# Patient Record
Sex: Male | Born: 1970
Health system: Southern US, Community
[De-identification: ages and names within clinical notes are randomized; demographics above are authoritative.]

---

## 2004-10-25 ENCOUNTER — Emergency Department (HOSPITAL_COMMUNITY): Admission: EM | Admit: 2004-10-25 | Discharge: 2004-10-26 | Payer: Self-pay | Admitting: Emergency Medicine

## 2011-11-13 ENCOUNTER — Ambulatory Visit (INDEPENDENT_AMBULATORY_CARE_PROVIDER_SITE_OTHER): Payer: 59 | Admitting: Surgery

## 2015-04-11 ENCOUNTER — Encounter (HOSPITAL_COMMUNITY): Payer: Self-pay | Admitting: Emergency Medicine

## 2015-04-11 ENCOUNTER — Encounter (HOSPITAL_COMMUNITY): Payer: Self-pay | Admitting: *Deleted

## 2015-04-11 ENCOUNTER — Emergency Department (HOSPITAL_COMMUNITY)
Admission: EM | Admit: 2015-04-11 | Discharge: 2015-04-12 | Disposition: A | Discharge status: Home / Self Care | Payer: 59 | Attending: Emergency Medicine | Admitting: Emergency Medicine

## 2015-04-11 ENCOUNTER — Emergency Department (HOSPITAL_COMMUNITY)
Admission: EM | Admit: 2015-04-11 | Discharge: 2015-04-11 | Disposition: A | Discharge status: Home / Self Care | Payer: 59 | Attending: Emergency Medicine | Admitting: Emergency Medicine

## 2015-04-11 DIAGNOSIS — R197 Diarrhea, unspecified: Secondary | ICD-10-CM | POA: Diagnosis not present

## 2015-04-11 DIAGNOSIS — R112 Nausea with vomiting, unspecified: Secondary | ICD-10-CM | POA: Insufficient documentation

## 2015-04-11 DIAGNOSIS — R1115 Cyclical vomiting syndrome unrelated to migraine: Secondary | ICD-10-CM

## 2015-04-11 DIAGNOSIS — R1013 Epigastric pain: Secondary | ICD-10-CM | POA: Insufficient documentation

## 2015-04-11 DIAGNOSIS — K2970 Gastritis, unspecified, without bleeding: Secondary | ICD-10-CM | POA: Diagnosis not present

## 2015-04-11 LAB — CBC WITH DIFFERENTIAL/PLATELET
Basophils Absolute: 0 10*3/uL (ref 0.0–0.1)
Basophils Relative: 0 % (ref 0–1)
Eosinophils Absolute: 0 10*3/uL (ref 0.0–0.7)
Eosinophils Relative: 0 % (ref 0–5)
HCT: 47 % (ref 39.0–52.0)
Hemoglobin: 16 g/dL (ref 13.0–17.0)
Lymphocytes Relative: 7 % — ABNORMAL LOW (ref 12–46)
Lymphs Abs: 0.7 10*3/uL (ref 0.7–4.0)
MCH: 29.9 pg (ref 26.0–34.0)
MCHC: 34 g/dL (ref 30.0–36.0)
MCV: 87.9 fL (ref 78.0–100.0)
Monocytes Absolute: 0.3 10*3/uL (ref 0.1–1.0)
Monocytes Relative: 3 % (ref 3–12)
Neutro Abs: 8.6 10*3/uL — ABNORMAL HIGH (ref 1.7–7.7)
Neutrophils Relative %: 90 % — ABNORMAL HIGH (ref 43–77)
Platelets: 215 10*3/uL (ref 150–400)
RBC: 5.35 MIL/uL (ref 4.22–5.81)
RDW: 14.4 % (ref 11.5–15.5)
WBC: 9.6 10*3/uL (ref 4.0–10.5)

## 2015-04-11 LAB — COMPREHENSIVE METABOLIC PANEL
ALT: 21 U/L (ref 0–53)
AST: 22 U/L (ref 0–37)
Albumin: 4 g/dL (ref 3.5–5.2)
Alkaline Phosphatase: 78 U/L (ref 39–117)
Anion gap: 13 (ref 5–15)
BUN: 23 mg/dL (ref 6–23)
CO2: 25 mmol/L (ref 19–32)
Calcium: 8.8 mg/dL (ref 8.4–10.5)
Chloride: 101 mmol/L (ref 96–112)
Creatinine, Ser: 1.25 mg/dL (ref 0.50–1.35)
GFR calc Af Amer: 79 mL/min — ABNORMAL LOW (ref >90)
GFR calc non Af Amer: 68 mL/min — ABNORMAL LOW (ref >90)
Glucose, Bld: 135 mg/dL — ABNORMAL HIGH (ref 70–99)
Potassium: 3.4 mmol/L — ABNORMAL LOW (ref 3.5–5.1)
Sodium: 139 mmol/L (ref 135–145)
Total Bilirubin: 0.6 mg/dL (ref 0.3–1.2)
Total Protein: 7.3 g/dL (ref 6.0–8.3)

## 2015-04-11 LAB — LIPASE, BLOOD: Lipase: 18 U/L (ref 11–59)

## 2015-04-11 MED ORDER — SUCRALFATE 1 G PO TABS: 1.0000 g | ORAL_TABLET | Freq: Once | ORAL | Status: DC

## 2015-04-11 MED ORDER — ONDANSETRON HCL 4 MG PO TABS: 4.0000 mg | ORAL_TABLET | Freq: Four times a day (QID) | ORAL | Status: AC

## 2015-04-11 MED ORDER — SODIUM CHLORIDE 0.9 % IV BOLUS (SEPSIS)
1000.0000 mL | Freq: Once | INTRAVENOUS | Status: AC
  Administered 2015-04-11: 1000 mL via INTRAVENOUS

## 2015-04-11 MED ORDER — PROMETHAZINE HCL 25 MG/ML IJ SOLN
12.5000 mg | Freq: Once | INTRAMUSCULAR | Status: AC
  Administered 2015-04-11: 12.5 mg via INTRAVENOUS
  Filled 2015-04-11: qty 1

## 2015-04-11 MED ORDER — GI COCKTAIL ~~LOC~~
30.0000 mL | Freq: Once | ORAL | Status: AC
  Administered 2015-04-11: 30 mL via ORAL
  Filled 2015-04-11: qty 30

## 2015-04-11 MED ORDER — ONDANSETRON HCL 4 MG/2ML IJ SOLN
4.0000 mg | Freq: Once | INTRAMUSCULAR | Status: AC
  Administered 2015-04-11: 4 mg via INTRAVENOUS
  Filled 2015-04-11: qty 2

## 2015-04-11 MED ORDER — KETOROLAC TROMETHAMINE 60 MG/2ML IM SOLN: 30.0000 mg | Freq: Once | INTRAMUSCULAR | Status: DC

## 2015-04-11 NOTE — ED Notes (Signed)
Pt assisted to restroom, pt vomited x1, otherwise tolerated well.  Will monitor.

## 2015-04-11 NOTE — ED Notes (Signed)
Pt presents c/o abdominal pain x 2 days.  Pt also reports N/V/D.  Pt a x 4, NAD.

## 2015-04-11 NOTE — Discharge Instructions (Signed)
Gastritis, Adult Gastritis is soreness and puffiness (inflammation) of the lining of the stomach. If you do not get help, gastritis can cause bleeding and sores (ulcers) in the stomach. HOME CARE   Only take medicine as told by your doctor.  If you were given antibiotic medicines, take them as told. Finish the medicines even if you start to feel better.  Drink enough fluids to keep your pee (urine) clear or pale yellow.  Avoid foods and drinks that make your problems worse. Foods you may want to avoid include:  Caffeine or alcohol.  Chocolate.  Mint.  Garlic and onions.  Spicy foods.  Citrus fruits, including oranges, lemons, or limes.  Food containing tomatoes, including sauce, chili, salsa, and pizza.  Fried and fatty foods.  Eat small meals throughout the day instead of large meals. GET HELP RIGHT AWAY IF:   You have black or dark red poop (stools).  You throw up (vomit) blood. It may look like coffee grounds.  You cannot keep fluids down.  Your belly (abdominal) pain gets worse.  You have a fever.  You do not feel better after 1 week.  You have any other questions or concerns. MAKE SURE YOU:   Understand these instructions.  Will watch your condition.  Will get help right away if you are not doing well or get worse. Document Released: 05/21/2008 Document Revised: 02/25/2012 Document Reviewed: 01/16/2012 [facility] Patient Information 2015 [employer], [place]. This information is not intended to replace advice given to you by your health care provider. Make sure you discuss any questions you have with your health care provider.  Please use medication as directed for nausea. Plenty fluids, bananas rice apples and toast diet till diarrhea discontinues. If you're not able to tolerate fluid intake and continue to appearance vomiting and diarrhea please seek additional medical care.

## 2015-04-11 NOTE — ED Notes (Signed)
Pt seen here for same this morning and D/C'd with Rx but unable to keep PO medications down;

## 2015-04-11 NOTE — ED Notes (Signed)
PA at bedside.

## 2015-04-11 NOTE — ED Provider Notes (Signed)
CSN: [id number]     Arrival date & time 04/11/15  0909 History   First MD Initiated Contact with Patient 04/11/15 [phone]     Chief Complaint  Patient presents with  . Abdominal Pain     HPI   44 year old male presents with nausea vomiting. Patient reports that Saturday he experienced multiple episodes of nausea and nonbloody emesis. He states this is continuing since with the addition of epigastric abdominal pain and diarrhea. He states that since the onset of symptoms she's not been able to eat or drink and has been resting. Patient denies fever, headache, dizziness, chest pain, lower abdominal pain, changes in bowel or bladder characteristics or frequency the exception of diarrhea, or lower extremity swelling. Patient notes he is otherwise healthy and does not take any medications, no alcohol ingestion, no use of illicit drugs. Patient denies close sick contacts, exposure to abnormal food or drink, no recent travel history.  No past medical history on file. No past surgical history on file. No family history on file. History  Substance Use Topics  . Smoking status: Not on file  . Smokeless tobacco: Not on file  . Alcohol Use: Not on file    Review of Systems  All other systems reviewed and are negative.   Allergies  Review of patient's allergies indicates not on file.  Home Medications   Prior to Admission medications   Not on File   BP 141/88 mmHg  Pulse 58  Temp(Src) 97.9 F (36.6 C) (Oral)  Resp 16  Ht 5\' 10"  (1.778 m)  Wt 180 lb (81.647 kg)  BMI 25.83 kg/m2  SpO2 100% Physical Exam  Constitutional: He is oriented to person, place, and time. He appears well-developed and well-nourished.  HENT:  Head: Normocephalic and atraumatic.  Eyes: Pupils are equal, round, and reactive to light.  Neck: Normal range of motion. Neck supple. No JVD present. No tracheal deviation present. No thyromegaly present.  Cardiovascular: Normal rate, regular rhythm, normal heart sounds and  intact distal pulses.  Exam reveals no gallop and no friction rub.   No murmur heard. Pulmonary/Chest: Effort normal and breath sounds normal. No stridor. No respiratory distress. He has no wheezes. He has no rales. He exhibits no tenderness.  Abdominal: Soft. Normal appearance and bowel sounds are normal. There is no hepatosplenomegaly, splenomegaly or hepatomegaly. There is tenderness in the epigastric area. There is no rigidity, no rebound, no guarding, no CVA tenderness, no tenderness at McBurney's point and negative Murphy's sign. No hernia. Hernia confirmed negative in the ventral area.  Musculoskeletal: Normal range of motion.  Lymphadenopathy:    He has no cervical adenopathy.  Neurological: He is alert and oriented to person, place, and time. Coordination normal.  Skin: Skin is warm and dry.  Psychiatric: He has a normal mood and affect. His behavior is normal. Judgment and thought content normal.  Nursing note and vitals reviewed.   ED Course  Procedures (including critical care time) Labs Review Labs Reviewed - No data to display  Imaging Review No results found.   EKG Interpretation None      MDM   Final diagnoses:  Gastritis   Labs: CBC, CMP, lipase no significant findings  Imaging: None indicated  Consults: None  Therapeutics: Normal saline, Zofran, Phenergan  Assessment: Gastritis  Plan: Patient's presentation likely represents gastritis. Patient was able to ambulate without difficulty, reports improvement with GI cocktail, abdominal pain has not worsened since presentation. Patient continues to move bowels making small bowel  obstruction unlikely. No history of GERD related symptoms, bloody stools, dark stools; unlikely gastric ulcer. Patient was afebrile with normal vital signs throughout stay; improved from initial evaluation. Patient was discharged home with Zofran as needed for the nausea and vomiting, and strict return precautions were given in the event  symptoms did not improve or worsen. Patient understood and agreed to plan, and had no acute concerns at time of discharge.      Eyvonne Mechanic, PA-C 04/11/15 1529  Tilden Fossa, MD 04/11/15 [phone]

## 2015-04-12 LAB — CBC WITH DIFFERENTIAL/PLATELET
Basophils Absolute: 0 10*3/uL (ref 0.0–0.1)
Basophils Relative: 0 % (ref 0–1)
Eosinophils Absolute: 0 10*3/uL (ref 0.0–0.7)
Eosinophils Relative: 0 % (ref 0–5)
HCT: 44.1 % (ref 39.0–52.0)
Hemoglobin: 15 g/dL (ref 13.0–17.0)
Lymphocytes Relative: 9 % — ABNORMAL LOW (ref 12–46)
Lymphs Abs: 1.2 10*3/uL (ref 0.7–4.0)
MCH: 29.5 pg (ref 26.0–34.0)
MCHC: 34 g/dL (ref 30.0–36.0)
MCV: 86.6 fL (ref 78.0–100.0)
Monocytes Absolute: 1.1 10*3/uL — ABNORMAL HIGH (ref 0.1–1.0)
Monocytes Relative: 8 % (ref 3–12)
Neutro Abs: 11.9 10*3/uL — ABNORMAL HIGH (ref 1.7–7.7)
Neutrophils Relative %: 83 % — ABNORMAL HIGH (ref 43–77)
Platelets: 215 10*3/uL (ref 150–400)
RBC: 5.09 MIL/uL (ref 4.22–5.81)
RDW: 14.3 % (ref 11.5–15.5)
WBC: 14.2 10*3/uL — ABNORMAL HIGH (ref 4.0–10.5)

## 2015-04-12 LAB — COMPREHENSIVE METABOLIC PANEL
ALT: 18 U/L (ref 0–53)
AST: 18 U/L (ref 0–37)
Albumin: 3.7 g/dL (ref 3.5–5.2)
Alkaline Phosphatase: 70 U/L (ref 39–117)
Anion gap: 10 (ref 5–15)
BUN: 24 mg/dL — ABNORMAL HIGH (ref 6–23)
CO2: 27 mmol/L (ref 19–32)
Calcium: 8.5 mg/dL (ref 8.4–10.5)
Chloride: 102 mmol/L (ref 96–112)
Creatinine, Ser: 1.18 mg/dL (ref 0.50–1.35)
GFR calc Af Amer: 85 mL/min — ABNORMAL LOW (ref >90)
GFR calc non Af Amer: 73 mL/min — ABNORMAL LOW (ref >90)
Glucose, Bld: 131 mg/dL — ABNORMAL HIGH (ref 70–99)
Potassium: 3.5 mmol/L (ref 3.5–5.1)
Sodium: 139 mmol/L (ref 135–145)
Total Bilirubin: 0.6 mg/dL (ref 0.3–1.2)
Total Protein: 6.6 g/dL (ref 6.0–8.3)

## 2015-04-12 LAB — LIPASE, BLOOD: Lipase: 25 U/L (ref 11–59)

## 2015-04-12 MED ORDER — PROMETHAZINE HCL 25 MG RE SUPP: 25.0000 mg | Freq: Four times a day (QID) | RECTAL | Status: DC | PRN

## 2015-04-12 MED ORDER — SODIUM CHLORIDE 0.9 % IV BOLUS (SEPSIS)
1000.0000 mL | Freq: Once | INTRAVENOUS | Status: AC
  Administered 2015-04-12: 1000 mL via INTRAVENOUS

## 2015-04-12 MED ORDER — METOCLOPRAMIDE HCL 10 MG PO TABS: 10.0000 mg | ORAL_TABLET | Freq: Four times a day (QID) | ORAL | Status: DC

## 2015-04-12 MED ORDER — PANTOPRAZOLE SODIUM 40 MG IV SOLR
40.0000 mg | Freq: Once | INTRAVENOUS | Status: AC
  Administered 2015-04-12: 40 mg via INTRAVENOUS
  Filled 2015-04-12: qty 40

## 2015-04-12 MED ORDER — PROMETHAZINE HCL 25 MG/ML IJ SOLN
12.5000 mg | Freq: Once | INTRAMUSCULAR | Status: AC
  Administered 2015-04-12: 12.5 mg via INTRAVENOUS
  Filled 2015-04-12: qty 1

## 2015-04-12 MED ORDER — METOCLOPRAMIDE HCL 5 MG/ML IJ SOLN
10.0000 mg | Freq: Once | INTRAMUSCULAR | Status: AC
  Administered 2015-04-12: 10 mg via INTRAVENOUS
  Filled 2015-04-12: qty 2

## 2015-04-12 NOTE — ED Provider Notes (Signed)
CSN: [id number]     Arrival date & time 04/11/15  2033 History   First MD Initiated Contact with Patient 04/11/15 2351     Chief Complaint  Patient presents with  . Emesis  . Abdominal Pain     (Consider location/radiation/quality/duration/timing/severity/associated sxs/prior Treatment) HPI Amirr Achord is a 44 y.o. male with no medical problems, presents to emergency department complaining of nausea, vomiting, epigastric abdominal pain, diarrhea. Patient's symptoms started 3 days ago. He states he is persistently vomiting since then. He is unable to keep any liquids or solids down. He was seen for this this morning, was given GI cocktail and Zofran which he states did not help. He was discharged home without oral trial. He was given a prescription for 4 mg Zofran which he states did not dissolve in his mouth so he chewed it up but it did not help his nausea. He states even small sips of water he throws up. His states that his pain is just in the epigastric area and is not sure if it is from vomiting. He denies any alcohol use. He admits to marijuana use daily. No other illicit drugs.  History reviewed. No pertinent past medical history. History reviewed. No pertinent past surgical history. History reviewed. No pertinent family history. History  Substance Use Topics  . Smoking status: Never Smoker   . Smokeless tobacco: Not on file  . Alcohol Use: No    Review of Systems  Constitutional: Negative for fever and chills.  Respiratory: Negative for cough, chest tightness and shortness of breath.   Cardiovascular: Negative for chest pain, palpitations and leg swelling.  Gastrointestinal: Positive for nausea, vomiting, abdominal pain and diarrhea. Negative for abdominal distention.  Genitourinary: Negative for dysuria, urgency, frequency and hematuria.  Musculoskeletal: Negative for myalgias, arthralgias, neck pain and neck stiffness.  Skin: Negative for rash.  Allergic/Immunologic:  Negative for immunocompromised state.  Neurological: Negative for dizziness, weakness, light-headedness, numbness and headaches.  All other systems reviewed and are negative.     Allergies  Review of patient's allergies indicates no known allergies.  Home Medications   Prior to Admission medications   Medication Sig Start Date End Date Taking? Authorizing Provider  ondansetron (ZOFRAN) 4 MG tablet Take 1 tablet (4 mg total) by mouth every 6 (six) hours. 04/11/15  Yes Jeffrey Hedges, PA-C   BP 131/96 mmHg  Pulse 72  Temp(Src) 98 F (36.7 C) (Oral)  Resp 16  Ht  (1.803 m)  Wt 180 lb (81.647 kg)  BMI 25.12 kg/m2  SpO2 99% Physical Exam  Constitutional: He is oriented to person, place, and time. He appears well-developed and well-nourished. No distress.  HENT:  Head: Normocephalic and atraumatic.  Oral mucosa dry  Eyes: Conjunctivae are normal.  Neck: Neck supple.  Cardiovascular: Normal rate, regular rhythm and normal heart sounds.   Pulmonary/Chest: Effort normal. No respiratory distress. He has no wheezes. He has no rales.  Abdominal: Soft. Bowel sounds are normal. He exhibits no distension. There is tenderness. There is no rebound and no guarding.  Epigastric tenderness  Musculoskeletal: He exhibits no edema.  Neurological: He is alert and oriented to person, place, and time.  Skin: Skin is warm and dry.  Nursing note and vitals reviewed.   ED Course  Procedures (including critical care time) Labs Review Labs Reviewed  CBC WITH DIFFERENTIAL/PLATELET - Abnormal; Notable for the following:    WBC 14.2 (*)    Neutrophils Relative % 83 (*)    Neutro Abs  11.9 (*)    Lymphocytes Relative 9 (*)    Monocytes Absolute 1.1 (*)    All other components within normal limits  COMPREHENSIVE METABOLIC PANEL - Abnormal; Notable for the following:    Glucose, Bld 131 (*)    BUN 24 (*)    GFR calc non Af Amer 73 (*)    GFR calc Af Amer 85 (*)    All other components within  normal limits  LIPASE, BLOOD    Imaging Review No results found.   EKG Interpretation None      MDM   Final diagnoses:  Non-intractable cyclical vomiting with nausea     Sheila nausea, vomiting, diarrhea for 3 days, seen this morning for the same. States his discharge was Zofran which is not helping. He continues to vomit after he eats or drinks anything. His abdominal exam is benign with only slight tenderness in epigastric area. I do not think patient has a surgical abdomen at this time. Will repeat labs and will try Reglan for emesis.  1:22 AM Patient is feeling much better. Abdomen reassessed, no tenderness at this time. I do not think he needs any imaging. Gross possibility of cannabinoid cyclical vomiting syndrome, given he has had similar symptoms in the past. Instructed to stop smoking marijuana. We'll try to see if patient can tolerate oral fluids.  1:59 AM Patient drinks 2 cups of water. He is feeling better. We'll discharge home with Reglan and Phenergan suppositories. Follow-up as needed. Patient is comfortable going home  Filed Vitals:   04/12/15 0100 04/12/15 0115 04/12/15 0145 04/12/15 0147  BP: 123/66 123/59 133/59   Pulse: 67 83  62  Temp:      TempSrc:      Resp:      Height:      Weight:      SpO2: 98% 96% 98%      Jaynie Crumbleatyana Keino Placencia, PA-C 04/12/15 0200  Mancel BaleElliott Wentz, MD 04/12/15 2336

## 2015-04-12 NOTE — Discharge Instructions (Signed)
Phenergan suppositories for vomiting. reglan in addition for nausea relief. Follow up with your doctor or wellness center as referred. Try to quit smoking marijuana. Return if worsening.   Nausea and Vomiting Nausea is a sick feeling that often comes before throwing up (vomiting). Vomiting is a reflex where stomach contents come out of your mouth. Vomiting can cause severe loss of body fluids (dehydration). Children and elderly adults can become dehydrated quickly, especially if they also have diarrhea. Nausea and vomiting are symptoms of a condition or disease. It is important to find the cause of your symptoms. CAUSES   Direct irritation of the stomach lining. This irritation can result from increased acid production (gastroesophageal reflux disease), infection, food poisoning, taking certain medicines (such as nonsteroidal anti-inflammatory drugs), alcohol use, or tobacco use.  Signals from the brain.These signals could be caused by a headache, heat exposure, an inner ear disturbance, increased pressure in the brain from injury, infection, a tumor, or a concussion, pain, emotional stimulus, or metabolic problems.  An obstruction in the gastrointestinal tract (bowel obstruction).  Illnesses such as diabetes, hepatitis, gallbladder problems, appendicitis, kidney problems, cancer, sepsis, atypical symptoms of a heart attack, or eating disorders.  Medical treatments such as chemotherapy and radiation.  Receiving medicine that makes you sleep (general anesthetic) during surgery. DIAGNOSIS Your caregiver may ask for tests to be done if the problems do not improve after a few days. Tests may also be done if symptoms are severe or if the reason for the nausea and vomiting is not clear. Tests may include:  Urine tests.  Blood tests.  Stool tests.  Cultures (to look for evidence of infection).  X-rays or other imaging studies. Test results can help your caregiver make decisions about treatment  or the need for additional tests. TREATMENT You need to stay well hydrated. Drink frequently but in small amounts.You may wish to drink water, sports drinks, clear broth, or eat frozen ice pops or gelatin dessert to help stay hydrated.When you eat, eating slowly may help prevent nausea.There are also some antinausea medicines that may help prevent nausea. HOME CARE INSTRUCTIONS   Take all medicine as directed by your caregiver.  If you do not have an appetite, do not force yourself to eat. However, you must continue to drink fluids.  If you have an appetite, eat a normal diet unless your caregiver tells you differently.  Eat a variety of complex carbohydrates (rice, wheat, potatoes, bread), lean meats, yogurt, fruits, and vegetables.  Avoid high-fat foods because they are more difficult to digest.  Drink enough water and fluids to keep your urine clear or pale yellow.  If you are dehydrated, ask your caregiver for specific rehydration instructions. Signs of dehydration may include:  Severe thirst.  Dry lips and mouth.  Dizziness.  Dark urine.  Decreasing urine frequency and amount.  Confusion.  Rapid breathing or pulse. SEEK IMMEDIATE MEDICAL CARE IF:   You have blood or brown flecks (like coffee grounds) in your vomit.  You have black or bloody stools.  You have a severe headache or stiff neck.  You are confused.  You have severe abdominal pain.  You have chest pain or trouble breathing.  You do not urinate at least once every 8 hours.  You develop cold or clammy skin.  You continue to vomit for longer than 24 to 48 hours.  You have a fever. MAKE SURE YOU:   Understand these instructions.  Will watch your condition.  Will get help right  away if you are not doing well or get worse. Document Released: 12/03/2005 Document Revised: 02/25/2012 Document Reviewed: 05/02/2011 [facility] Patient Information 2015 [place], [place]. This information is not intended  to replace advice given to you by your health care provider. Make sure you discuss any questions you have with your health care provider.

## 2015-09-27 ENCOUNTER — Emergency Department (HOSPITAL_COMMUNITY)
Admission: EM | Admit: 2015-09-27 | Discharge: 2015-09-27 | Disposition: A | Discharge status: Home / Self Care | Payer: 59 | Attending: Emergency Medicine | Admitting: Emergency Medicine

## 2015-09-27 ENCOUNTER — Encounter (HOSPITAL_COMMUNITY): Payer: Self-pay | Admitting: Emergency Medicine

## 2015-09-27 DIAGNOSIS — Y9389 Activity, other specified: Secondary | ICD-10-CM | POA: Insufficient documentation

## 2015-09-27 DIAGNOSIS — T148XXA Other injury of unspecified body region, initial encounter: Secondary | ICD-10-CM

## 2015-09-27 DIAGNOSIS — X58XXXA Exposure to other specified factors, initial encounter: Secondary | ICD-10-CM | POA: Insufficient documentation

## 2015-09-27 DIAGNOSIS — S30851A Superficial foreign body of abdominal wall, initial encounter: Secondary | ICD-10-CM | POA: Insufficient documentation

## 2015-09-27 DIAGNOSIS — Y9289 Other specified places as the place of occurrence of the external cause: Secondary | ICD-10-CM | POA: Insufficient documentation

## 2015-09-27 DIAGNOSIS — Y998 Other external cause status: Secondary | ICD-10-CM | POA: Insufficient documentation

## 2015-09-27 MED ORDER — LIDOCAINE-EPINEPHRINE (PF) 2 %-1:200000 IJ SOLN
10.0000 mL | Freq: Once | INTRAMUSCULAR | Status: AC
  Administered 2015-09-27: 10 mL
  Filled 2015-09-27: qty 20

## 2015-09-27 MED ORDER — ONDANSETRON 4 MG PO TBDP
8.0000 mg | ORAL_TABLET | Freq: Once | ORAL | Status: AC
  Administered 2015-09-27: 8 mg via ORAL
  Filled 2015-09-27: qty 2

## 2015-09-27 NOTE — Discharge Instructions (Signed)
Read the information below.  You may return to the Emergency Department at any time for worsening condition or any new symptoms that concern you.  If you develop redness, swelling, pus draining from the wound, or fevers greater than 100.4, return to the ER immediately for a recheck.   °

## 2015-09-27 NOTE — ED Notes (Signed)
Pt from home for eval of splinter in skin, pt states he was sliding wood this morning when he felt a splinter go into the skin on abdomen, pt states tried to pull it out but states feels like its still there. No n/v/d noted. Pt alert and oriented, skin warm and dry. reddness noted to lower abd at site.

## 2015-09-27 NOTE — ED Notes (Signed)
Pt became pale and diaphoretic after Lidocaine injection to abd.

## 2015-09-27 NOTE — ED Notes (Signed)
Pt had strong urge to go to bathroom. Head elevated gradually. Pt remained warm and dry at this time. Assisted to bathroom.

## 2015-09-27 NOTE — ED Provider Notes (Signed)
CSN: [id number]     Arrival date & time 09/27/15  1102 History  By signing my name below, I, Willie Scott, attest that this documentation has been prepared under the direction and in the presence of [facility], PA-C. Electronically Signed: Charline Bills, ED Scribe 09/27/2015 at 12:18 PM.   Chief Complaint  Patient presents with  . Foreign Body in Skin   The history is provided by the patient. No language interpreter was used.   HPI Comments: Willie Scott is a 44 y.o. male who presents to the Emergency Department complaining of a foreign object that entered the skin in his lower right abdomen this morning. Pt states that he was sliding a piece of wood on his deck when he sustained a painful splinter to his abdomen. He describes pain as a constant burning sensation that is exacerbated with palpation. Pt tried to remove the splinter without success. He denies abdominal pain or other symptoms. No known medical allergies.  Last tetanus vx 4 years ago.    History reviewed. No pertinent past medical history. History reviewed. No pertinent past surgical history. No family history on file. Social History  Substance Use Topics  . Smoking status: Never Smoker   . Smokeless tobacco: None  . Alcohol Use: No    Review of Systems  Constitutional: Negative for fever.  Gastrointestinal: Negative for vomiting and abdominal pain.  Skin: Positive for wound. Negative for color change.  Allergic/Immunologic: Negative for immunocompromised state.  Hematological: Does not bruise/bleed easily.  Psychiatric/Behavioral: Negative for self-injury.   Allergies  Review of patient's allergies indicates no known allergies.  Home Medications   Prior to Admission medications   Medication Sig Start Date End Date Taking? Authorizing Provider  metoCLOPramide (REGLAN) 10 MG tablet Take 1 tablet (10 mg total) by mouth every 6 (six) hours. 04/12/15   Tatyana Kirichenko, PA-C  ondansetron (ZOFRAN) 4 MG tablet Take  1 tablet (4 mg total) by mouth every 6 (six) hours. 04/11/15   Eyvonne Mechanic, PA-C  promethazine (PHENERGAN) 25 MG suppository Place 1 suppository (25 mg total) rectally every 6 (six) hours as needed for nausea or vomiting. 04/12/15   Tatyana Kirichenko, PA-C   BP 125/81 mmHg  Pulse 83  Temp(Src) 98 F (36.7 C) (Oral)  Resp 16  Ht  (1.803 m)  Wt 184 lb (83.462 kg)  BMI 25.67 kg/m2  SpO2 99% Physical Exam  Constitutional: He appears well-developed and well-nourished. No distress.  HENT:  Head: Normocephalic and atraumatic.  Neck: Neck supple.  Pulmonary/Chest: Effort normal.  Abdominal: Soft. There is no tenderness. There is no rebound and no guarding.  Neurological: He is alert.  Skin: He is not diaphoretic.  Palpable linear foreign body in the subcutaneous tissues of the RLQ.   Psychiatric: He has a normal mood and affect. His behavior is normal.  Nursing note and vitals reviewed.  ED Course  .Foreign Body Removal Date/Time: 09/27/2015 12:40 PM Performed by: Trixie Dredge Authorized by: Trixie Dredge Consent: Verbal consent obtained. Consent given by: patient Patient understanding: patient states understanding of the procedure being performed Body area: skin General location: trunk Location details: abdomen Anesthesia: local infiltration Local anesthetic: lidocaine 2% with epinephrine Anesthetic total: 5 ml Patient sedated: no Patient restrained: no Patient cooperative: yes Localization method: palpated. Removal mechanism: hemostat and scalpel Dressing: antibiotic ointment and dressing applied Tendon involvement: none Depth: subcutaneous Complexity: simple 1 objects recovered. Objects recovered: wooden splinter Post-procedure assessment: foreign body removed Patient tolerance: Patient tolerated the procedure  well with no immediate complications Comments: Single 4-0 vicryl rapide suture placed, simple interrupted   (including critical care time) DIAGNOSTIC  STUDIES: Oxygen Saturation is 99% on RA, normal by my interpretation.    COORDINATION OF CARE: 11:56 AM-Discussed treatment plan with pt at bedside and pt agreed to plan.   Labs Review Labs Reviewed - No data to display  Imaging Review No results found. I have personally reviewed and evaluated these images and lab results as part of my medical decision-making.   EKG Interpretation None      \ MDM   Final diagnoses:  Foreign body in skin   Afebrile, nontoxic patient with foreign body in subcutaneous tissues of abdomen that occurred just prior to arrival.  Tetanus UTD.   Wound thoroughly cleansed, one suture placed.  Wound was new, pressure treated.  D/C home with wound care instructions.  Discussed result, findings, treatment, and follow up  with patient.  Pt given return precautions.  Pt verbalizes understanding and agrees with plan.        I personally performed the services described in this documentation, which was scribed in my presence. The recorded information has been reviewed and is accurate.    Trixie Dredge, PA-C 09/27/15 1343  Melene Plan, DO 09/27/15 1440

## 2016-04-15 ENCOUNTER — Encounter (HOSPITAL_COMMUNITY): Payer: Self-pay | Admitting: *Deleted

## 2016-04-15 ENCOUNTER — Emergency Department (HOSPITAL_COMMUNITY)
Admission: EM | Admit: 2016-04-15 | Discharge: 2016-04-15 | Disposition: A | Discharge status: Home / Self Care | Payer: 59 | Attending: Emergency Medicine | Admitting: Emergency Medicine

## 2016-04-15 DIAGNOSIS — R1013 Epigastric pain: Secondary | ICD-10-CM | POA: Insufficient documentation

## 2016-04-15 DIAGNOSIS — Z79899 Other long term (current) drug therapy: Secondary | ICD-10-CM | POA: Insufficient documentation

## 2016-04-15 DIAGNOSIS — R112 Nausea with vomiting, unspecified: Secondary | ICD-10-CM | POA: Diagnosis not present

## 2016-04-15 DIAGNOSIS — R197 Diarrhea, unspecified: Secondary | ICD-10-CM | POA: Diagnosis present

## 2016-04-15 LAB — COMPREHENSIVE METABOLIC PANEL
ALT: 21 U/L (ref 17–63)
AST: 21 U/L (ref 15–41)
Albumin: 4.4 g/dL (ref 3.5–5.0)
Alkaline Phosphatase: 75 U/L (ref 38–126)
Anion gap: 12 (ref 5–15)
BUN: 31 mg/dL — ABNORMAL HIGH (ref 6–20)
CO2: 28 mmol/L (ref 22–32)
Calcium: 9.5 mg/dL (ref 8.9–10.3)
Chloride: 99 mmol/L — ABNORMAL LOW (ref 101–111)
Creatinine, Ser: 1.08 mg/dL (ref 0.61–1.24)
GFR calc Af Amer: >60 mL/min (ref >60)
GFR calc non Af Amer: >60 mL/min (ref >60)
Glucose, Bld: 122 mg/dL — ABNORMAL HIGH (ref 65–99)
Potassium: 3.2 mmol/L — ABNORMAL LOW (ref 3.5–5.1)
Sodium: 139 mmol/L (ref 135–145)
Total Bilirubin: 1.1 mg/dL (ref 0.3–1.2)
Total Protein: 7.6 g/dL (ref 6.5–8.1)

## 2016-04-15 LAB — LIPASE, BLOOD: Lipase: 23 U/L (ref 11–51)

## 2016-04-15 LAB — CBC
HCT: 51.2 % (ref 39.0–52.0)
Hemoglobin: 17.4 g/dL — ABNORMAL HIGH (ref 13.0–17.0)
MCH: 30.2 pg (ref 26.0–34.0)
MCHC: 34 g/dL (ref 30.0–36.0)
MCV: 88.9 fL (ref 78.0–100.0)
Platelets: 267 10*3/uL (ref 150–400)
RBC: 5.76 MIL/uL (ref 4.22–5.81)
RDW: 14.8 % (ref 11.5–15.5)
WBC: 16.1 10*3/uL — ABNORMAL HIGH (ref 4.0–10.5)

## 2016-04-15 MED ORDER — POTASSIUM CHLORIDE 10 MEQ/100ML IV SOLN
10.0000 meq | Freq: Once | INTRAVENOUS | Status: AC
  Administered 2016-04-15: 10 meq via INTRAVENOUS
  Filled 2016-04-15: qty 100

## 2016-04-15 MED ORDER — SODIUM CHLORIDE 0.9 % IV BOLUS (SEPSIS)
1000.0000 mL | Freq: Once | INTRAVENOUS | Status: AC
  Administered 2016-04-15: 1000 mL via INTRAVENOUS

## 2016-04-15 MED ORDER — METOCLOPRAMIDE HCL 5 MG/ML IJ SOLN
10.0000 mg | Freq: Once | INTRAMUSCULAR | Status: AC
  Administered 2016-04-15: 10 mg via INTRAVENOUS
  Filled 2016-04-15: qty 2

## 2016-04-15 MED ORDER — ONDANSETRON 4 MG PO TBDP: ORAL_TABLET | ORAL | Status: DC

## 2016-04-15 MED ORDER — ONDANSETRON HCL 4 MG/2ML IJ SOLN
4.0000 mg | Freq: Once | INTRAMUSCULAR | Status: AC
  Administered 2016-04-15: 4 mg via INTRAVENOUS
  Filled 2016-04-15: qty 2

## 2016-04-15 MED ORDER — KETOROLAC TROMETHAMINE 15 MG/ML IJ SOLN
15.0000 mg | Freq: Once | INTRAMUSCULAR | Status: AC
  Administered 2016-04-15: 15 mg via INTRAVENOUS
  Filled 2016-04-15: qty 1

## 2016-04-15 NOTE — ED Provider Notes (Signed)
CSN: [id number]     Arrival date & time 04/15/16  1519 History   First MD Initiated Contact with Patient 04/15/16 1850     Chief Complaint  Patient presents with  . Emesis  . Diarrhea     (Consider location/radiation/quality/duration/timing/severity/associated sxs/prior Treatment) HPI Comments: 45 year old male with no significant medical history no surgery history no history colonoscopy or GI issues presents with recurrent vomiting and diarrhea no blood in the past 3 days. No significant sick contacts, no surgery, no travel, no recent antibiotics. Intermittent symptoms. Worse epigastric discomfort  Patient is a 45 y.o. male presenting with vomiting and diarrhea. The history is provided by the patient.  Emesis Associated symptoms: abdominal pain and diarrhea   Associated symptoms: no chills and no headaches   Diarrhea Associated symptoms: abdominal pain and vomiting   Associated symptoms: no chills, no fever and no headaches     History reviewed. No pertinent past medical history. History reviewed. No pertinent past surgical history. History reviewed. No pertinent family history. Social History  Substance Use Topics  . Smoking status: Never Smoker   . Smokeless tobacco: None  . Alcohol Use: No    Review of Systems  Constitutional: Negative for fever and chills.  HENT: Negative for congestion.   Eyes: Negative for visual disturbance.  Respiratory: Negative for shortness of breath.   Cardiovascular: Negative for chest pain.  Gastrointestinal: Positive for vomiting, abdominal pain and diarrhea. Negative for blood in stool.  Genitourinary: Negative for dysuria and flank pain.  Musculoskeletal: Negative for back pain, neck pain and neck stiffness.  Skin: Negative for rash.  Neurological: Negative for light-headedness and headaches.      Allergies  Review of patient's allergies indicates no known allergies.  Home Medications   Prior to Admission medications   Medication  Sig Start Date End Date Taking? Authorizing Provider  alum & mag hydroxide-simeth (MAALOX/MYLANTA) 200-200-20 MG/5ML suspension Take 30 mLs by mouth every 6 (six) hours as needed for indigestion or heartburn.   Yes Historical Provider, MD  metoCLOPramide (REGLAN) 10 MG tablet Take 1 tablet (10 mg total) by mouth every 6 (six) hours. 04/12/15   Tatyana Kirichenko, PA-C  ondansetron (ZOFRAN) 4 MG tablet Take 1 tablet (4 mg total) by mouth every 6 (six) hours. 04/11/15   Eyvonne Mechanic, PA-C  promethazine (PHENERGAN) 25 MG suppository Place 1 suppository (25 mg total) rectally every 6 (six) hours as needed for nausea or vomiting. 04/12/15   Tatyana Kirichenko, PA-C   BP 128/85 mmHg  Pulse 70  Temp(Src) 98.4 F (36.9 C) (Oral)  Resp 15  Ht  (1.803 m)  Wt 166 lb 1.6 oz (75.342 kg)  BMI 23.18 kg/m2  SpO2 93% Physical Exam  Constitutional: He is oriented to person, place, and time. He appears well-developed and well-nourished.  HENT:  Head: Normocephalic and atraumatic.  Dry mucous membranes  Eyes: Conjunctivae are normal. Right eye exhibits no discharge. Left eye exhibits no discharge.  Neck: Normal range of motion. Neck supple. No tracheal deviation present.  Cardiovascular: Normal rate and regular rhythm.   Pulmonary/Chest: Effort normal and breath sounds normal.  Abdominal: Soft. He exhibits no distension. There is tenderness (mild epigastric). There is no guarding.  Musculoskeletal: He exhibits no edema.  Neurological: He is alert and oriented to person, place, and time.  Skin: Skin is warm. No rash noted.  Psychiatric: He has a normal mood and affect.  Nursing note and vitals reviewed.   ED Course  Procedures (including critical  care time) Labs Review Labs Reviewed  COMPREHENSIVE METABOLIC PANEL - Abnormal; Notable for the following:    Potassium 3.2 (*)    Chloride 99 (*)    Glucose, Bld 122 (*)    BUN 31 (*)    All other components within normal limits  CBC - Abnormal;  Notable for the following:    WBC 16.1 (*)    Hemoglobin 17.4 (*)    All other components within normal limits  LIPASE, BLOOD  URINALYSIS, ROUTINE W REFLEX MICROSCOPIC (NOT AT [facility])    Imaging Review No results found. I have personally reviewed and evaluated these images and lab results as part of my medical decision-making.   EKG Interpretation None      MDM   Final diagnoses:  Epigastric pain  Nausea vomiting and diarrhea   Patient presents with clinical concern for gastroenteritis. No focal pain in the right lower quadrant or left lower quadrant. Plan for symptomatic care and reassessment in 48 hours if no improvement.  Pt improved in ED.  Mild epig pain.  IV fluid boluses, K and reasons to return given.  Pt comfortable without CT at this time. Results and differential diagnosis were discussed with the patient/parent/guardian. Xrays were independently reviewed by myself.  Close follow up outpatient was discussed, comfortable with the plan.   Medications  ondansetron (ZOFRAN) injection 4 mg (4 mg Intravenous Given 04/15/16 1927)  sodium chloride 0.9 % bolus 1,000 mL (0 mLs Intravenous Stopped 04/15/16 2109)  sodium chloride 0.9 % bolus 1,000 mL (1,000 mLs Intravenous New Bag/Given 04/15/16 2100)  ketorolac (TORADOL) 15 MG/ML injection 15 mg (15 mg Intravenous Given 04/15/16 2102)  metoCLOPramide (REGLAN) injection 10 mg (10 mg Intravenous Given 04/15/16 2102)  potassium chloride 10 mEq in 100 mL IVPB (10 mEq Intravenous New Bag/Given 04/15/16 2102)    Filed Vitals:   04/15/16 2015 04/15/16 2030 04/15/16 2045 04/15/16 2100  BP: 134/87 119/65 133/85 128/85  Pulse: 67 76 73 70  Temp:      TempSrc:      Resp: 15 11 16 15   Height:      Weight:      SpO2: 91% 93% 90% 93%    Final diagnoses:  Epigastric pain  Nausea vomiting and diarrhea       Blane OharaJoshua Tyrease Vandeberg, MD 04/15/16 2207

## 2016-04-15 NOTE — ED Notes (Signed)
Pt reports onset 3 days ago of LUQ pain and n/v with mild diarrhea.

## 2016-04-15 NOTE — Discharge Instructions (Signed)
Zofran for nausea. Return for blood in stools, right or left lower abdominal pain, worsening pain, fevers or no improvement in 48 hrs.  If you were given medicines take as directed.  If you are on coumadin or contraceptives realize their levels and effectiveness is altered by many different medicines.  If you have any reaction (rash, tongues swelling, other) to the medicines stop taking and see a physician.    If your blood pressure was elevated in the ER make sure you follow up for management with a primary doctor or return for chest pain, shortness of breath or stroke symptoms.  Please follow up as directed and return to the ER or see a physician for new or worsening symptoms.  Thank you. Filed Vitals:   04/15/16 2015 04/15/16 2030 04/15/16 2045 04/15/16 2100  BP: 134/87 119/65 133/85 128/85  Pulse: 67 76 73 70  Temp:      TempSrc:      Resp: 15 11 16 15   Height:      Weight:      SpO2: 91% 93% 90% 93%

## 2016-04-17 ENCOUNTER — Emergency Department (HOSPITAL_COMMUNITY): Payer: 59

## 2016-04-17 ENCOUNTER — Emergency Department (HOSPITAL_COMMUNITY)
Admission: EM | Admit: 2016-04-17 | Discharge: 2016-04-17 | Disposition: A | Discharge status: Home / Self Care | Payer: 59 | Attending: Emergency Medicine | Admitting: Emergency Medicine

## 2016-04-17 ENCOUNTER — Encounter (HOSPITAL_COMMUNITY): Payer: Self-pay | Admitting: Emergency Medicine

## 2016-04-17 DIAGNOSIS — Z79899 Other long term (current) drug therapy: Secondary | ICD-10-CM | POA: Insufficient documentation

## 2016-04-17 DIAGNOSIS — R1013 Epigastric pain: Secondary | ICD-10-CM | POA: Diagnosis not present

## 2016-04-17 DIAGNOSIS — Z87891 Personal history of nicotine dependence: Secondary | ICD-10-CM | POA: Diagnosis not present

## 2016-04-17 DIAGNOSIS — R112 Nausea with vomiting, unspecified: Secondary | ICD-10-CM

## 2016-04-17 LAB — CBC WITH DIFFERENTIAL/PLATELET
BASOS PCT: 0 %
Basophils Absolute: 0 10*3/uL (ref 0.0–0.1)
EOS ABS: 0.1 10*3/uL (ref 0.0–0.7)
EOS PCT: 0 %
HCT: 49.8 % (ref 39.0–52.0)
HEMOGLOBIN: 16.6 g/dL (ref 13.0–17.0)
LYMPHS ABS: 1.8 10*3/uL (ref 0.7–4.0)
Lymphocytes Relative: 15 %
MCH: 29.8 pg (ref 26.0–34.0)
MCHC: 33.3 g/dL (ref 30.0–36.0)
MCV: 89.4 fL (ref 78.0–100.0)
MONOS PCT: 10 %
Monocytes Absolute: 1.2 10*3/uL — ABNORMAL HIGH (ref 0.1–1.0)
NEUTROS PCT: 75 %
Neutro Abs: 8.7 10*3/uL — ABNORMAL HIGH (ref 1.7–7.7)
PLATELETS: 244 10*3/uL (ref 150–400)
RBC: 5.57 MIL/uL (ref 4.22–5.81)
RDW: 14.4 % (ref 11.5–15.5)
WBC: 11.8 10*3/uL — ABNORMAL HIGH (ref 4.0–10.5)

## 2016-04-17 LAB — COMPREHENSIVE METABOLIC PANEL
ALK PHOS: 65 U/L (ref 38–126)
ALT: 19 U/L (ref 17–63)
AST: 17 U/L (ref 15–41)
Albumin: 4.1 g/dL (ref 3.5–5.0)
Anion gap: 11 (ref 5–15)
BUN: 19 mg/dL (ref 6–20)
CALCIUM: 8.9 mg/dL (ref 8.9–10.3)
CHLORIDE: 98 mmol/L — AB (ref 101–111)
CO2: 27 mmol/L (ref 22–32)
CREATININE: 1.14 mg/dL (ref 0.61–1.24)
Glucose, Bld: 110 mg/dL — ABNORMAL HIGH (ref 65–99)
Potassium: 3.2 mmol/L — ABNORMAL LOW (ref 3.5–5.1)
SODIUM: 136 mmol/L (ref 135–145)
Total Bilirubin: 1.2 mg/dL (ref 0.3–1.2)
Total Protein: 7 g/dL (ref 6.5–8.1)

## 2016-04-17 LAB — URINALYSIS, ROUTINE W REFLEX MICROSCOPIC
BILIRUBIN URINE: NEGATIVE
Glucose, UA: NEGATIVE mg/dL
HGB URINE DIPSTICK: NEGATIVE
KETONES UR: 15 mg/dL — AB
Leukocytes, UA: NEGATIVE
NITRITE: NEGATIVE
PROTEIN: NEGATIVE mg/dL
Specific Gravity, Urine: 1.021 (ref 1.005–1.030)
pH: 7.5 (ref 5.0–8.0)

## 2016-04-17 LAB — URINE MICROSCOPIC-ADD ON: RBC / HPF: NONE SEEN RBC/hpf (ref 0–5)

## 2016-04-17 LAB — LIPASE, BLOOD: LIPASE: 23 U/L (ref 11–51)

## 2016-04-17 LAB — POC OCCULT BLOOD, ED: FECAL OCCULT BLD: NEGATIVE

## 2016-04-17 MED ORDER — PANTOPRAZOLE SODIUM 40 MG IV SOLR
40.0000 mg | Freq: Once | INTRAVENOUS | Status: AC
  Administered 2016-04-17: 40 mg via INTRAVENOUS
  Filled 2016-04-17: qty 40

## 2016-04-17 MED ORDER — SODIUM CHLORIDE 0.9 % IV BOLUS (SEPSIS)
1000.0000 mL | Freq: Once | INTRAVENOUS | Status: AC
  Administered 2016-04-17: 1000 mL via INTRAVENOUS

## 2016-04-17 MED ORDER — FAMOTIDINE 20 MG PO TABS: 20.0000 mg | ORAL_TABLET | Freq: Two times a day (BID) | ORAL | Status: AC

## 2016-04-17 MED ORDER — SODIUM CHLORIDE 0.9 % IV BOLUS (SEPSIS): 1000.0000 mL | Freq: Once | INTRAVENOUS | Status: DC

## 2016-04-17 MED ORDER — IOPAMIDOL (ISOVUE-300) INJECTION 61%
INTRAVENOUS | Status: AC
  Administered 2016-04-17: 100 mL via INTRAVENOUS
  Filled 2016-04-17: qty 100

## 2016-04-17 MED ORDER — PROMETHAZINE HCL 25 MG RE SUPP: 25.0000 mg | Freq: Four times a day (QID) | RECTAL | Status: AC | PRN

## 2016-04-17 MED ORDER — ONDANSETRON HCL 4 MG/2ML IJ SOLN
4.0000 mg | Freq: Once | INTRAMUSCULAR | Status: AC
  Administered 2016-04-17: 4 mg via INTRAVENOUS
  Filled 2016-04-17: qty 2

## 2016-04-17 NOTE — ED Notes (Signed)
Pt is in stable condition upon d/c and ambulates from ED. 

## 2016-04-17 NOTE — Discharge Instructions (Signed)

## 2016-04-17 NOTE — ED Provider Notes (Signed)
CSN: [id number]     Arrival date & time 04/17/16  0912 History   First MD Initiated Contact with Patient 04/17/16 [phone]     Chief Complaint  Patient presents with  . Emesis  . Abdominal Pain   PT HERE ON 4/30 FOR THE SAME.  HE SAID THAT HE'S HAD N/V SINCE THE 27TH.  HE SAID THE ZOFRAN RX GIVEN TO HIM HELPS FOR 10 MINUTES, THEN HE HAS THE N/V AGAIN.  NOW HE IS VOMITING BLOOD.  PT HAS SOME EPIGASTRIC PAIN, BUT SAID THAT IT IS NOT SEVERE AND HE DOES NOT CURRENTLY NEED ANYTHING FOR PAIN.  (Consider location/radiation/quality/duration/timing/severity/associated sxs/prior Treatment) Patient is a 45 y.o. male presenting with vomiting and abdominal pain. The history is provided by the patient.  Emesis Severity:  Moderate Timing:  Constant Quality:  Bright red blood Progression:  Worsening Chronicity:  Recurrent Recent urination:  Decreased Relieved by:  Nothing Ineffective treatments:  Antiemetics Associated symptoms: abdominal pain   Abdominal Pain Associated symptoms: nausea and vomiting     History reviewed. No pertinent past medical history. History reviewed. No pertinent past surgical history. No family history on file. Social History  Substance Use Topics  . Smoking status: Former Games developer  . Smokeless tobacco: None  . Alcohol Use: No    Review of Systems  Gastrointestinal: Positive for nausea, vomiting and abdominal pain.  All other systems reviewed and are negative.     Allergies  Review of patient's allergies indicates no known allergies.  Home Medications   Prior to Admission medications   Medication Sig Start Date End Date Taking? Authorizing Provider  ondansetron (ZOFRAN) 4 MG tablet Take 1 tablet (4 mg total) by mouth every 6 (six) hours. 04/11/15  Yes Jeffrey Hedges, PA-C  alum & mag hydroxide-simeth (MAALOX/MYLANTA) 200-200-20 MG/5ML suspension Take 30 mLs by mouth every 6 (six) hours as needed for indigestion or heartburn.    Historical Provider, MD  famotidine  (PEPCID) 20 MG tablet Take 1 tablet (20 mg total) by mouth 2 (two) times daily. 04/17/16   Jacalyn Lefevre, MD  metoCLOPramide (REGLAN) 10 MG tablet Take 1 tablet (10 mg total) by mouth every 6 (six) hours. Patient not taking: Reported on 04/17/2016 04/12/15   Jaynie Crumble, PA-C  ondansetron (ZOFRAN ODT) 4 MG disintegrating tablet  ODT q4 hours prn nausea/vomit Patient not taking: Reported on 04/17/2016 04/15/16   Blane Ohara, MD  promethazine (PHENERGAN) 25 MG suppository Place 1 suppository (25 mg total) rectally every 6 (six) hours as needed for nausea or vomiting. 04/17/16   Jacalyn Lefevre, MD   BP 130/88 mmHg  Pulse 62  Temp(Src) 98.4 F (36.9 C) (Oral)  Resp 14  Ht  (1.803 m)  Wt 166 lb (75.297 kg)  BMI 23.16 kg/m2  SpO2 100% Physical Exam  Constitutional: He is oriented to person, place, and time. He appears well-developed and well-nourished.  HENT:  Head: Normocephalic and atraumatic.  Right Ear: External ear normal.  Left Ear: External ear normal.  Nose: Nose normal.  Mouth/Throat: Mucous membranes are dry.  Eyes: Conjunctivae and EOM are normal. Pupils are equal, round, and reactive to light.  Neck: Normal range of motion. Neck supple.  Cardiovascular: Normal rate, regular rhythm, normal heart sounds and intact distal pulses.   Pulmonary/Chest: Effort normal and breath sounds normal.  Abdominal: Soft. Bowel sounds are normal. There is tenderness in the epigastric area.    Musculoskeletal: Normal range of motion.  Neurological: He is alert and oriented to person,  place, and time.  Skin: Skin is warm and dry.  Psychiatric: He has a normal mood and affect. His behavior is normal. Judgment and thought content normal.  Nursing note and vitals reviewed.   ED Course  Procedures (including critical care time) Labs Review Labs Reviewed  COMPREHENSIVE METABOLIC PANEL - Abnormal; Notable for the following:    Potassium 3.2 (*)    Chloride 98 (*)    Glucose, Bld 110  (*)    All other components within normal limits  CBC WITH DIFFERENTIAL/PLATELET - Abnormal; Notable for the following:    WBC 11.8 (*)    Neutro Abs 8.7 (*)    Monocytes Absolute 1.2 (*)    All other components within normal limits  URINALYSIS, ROUTINE W REFLEX MICROSCOPIC (NOT AT [facility]) - Abnormal; Notable for the following:    APPearance TURBID (*)    Ketones, ur 15 (*)    All other components within normal limits  URINE MICROSCOPIC-ADD ON - Abnormal; Notable for the following:    Squamous Epithelial / LPF 0-5 (*)    Bacteria, UA RARE (*)    All other components within normal limits  LIPASE, BLOOD  POC OCCULT BLOOD, ED    Imaging Review Ct Abdomen Pelvis W Contrast  04/17/2016  CLINICAL DATA:  Mid abdominal pain, nausea/vomiting EXAM: CT ABDOMEN AND PELVIS WITH CONTRAST TECHNIQUE: Multidetector CT imaging of the abdomen and pelvis was performed using the standard protocol following bolus administration of intravenous contrast. CONTRAST:  100 ISOVUE-300 IOPAMIDOL (ISOVUE-300) INJECTION 61% COMPARISON:  07/01/2014 FINDINGS: Lower chest:  Lung bases are clear.  Normal heart size. Hepatobiliary: Normal liver.  Normal gallbladder. Pancreas: Normal. Spleen: Normal. Adrenals/Urinary Tract: Normal adrenal glands. Normal kidneys. Normal bladder. No urolithiasis or obstructive uropathy. Stomach/Bowel: No bowel wall thickening or dilatation. No pneumatosis, pneumoperitoneum or portal venous gas. No abdominal or pelvic free fluid. Normal appendix. Vascular/Lymphatic: Normal caliber abdominal aorta. No lymphadenopathy. Other: No fluid collection or hematoma. Musculoskeletal: No acute osseous abnormality. No lytic or sclerotic osseous lesion. Mild degenerative changes of the acromioclavicular joint. IMPRESSION: 1. No acute abdominal or pelvic pathology. Electronically Signed   By: Elige KoHetal  Patel   On: 04/17/2016 12:10   I have personally reviewed and evaluated these images and lab results as part of my  medical decision-making.   EKG Interpretation   Date/Time:  Tuesday Apr 17 2016 09:25:36 EDT Ventricular Rate:  65 PR Interval:  132 QRS Duration: 100 QT Interval:  420 QTC Calculation: 437 R Axis:   89 Text Interpretation:  Sinus rhythm ST elev, probable normal early repol  pattern Confirmed by Jillane Po MD, Katara Griner ([id number]) on 04/17/2016 10:18:37 AM      MDM  PT IS ABLE TO TOLERATE PO FLUIDS.  HE FEELS BETTER.  HE KNOWS TO RETURN IF WORSE AND F/U WITH GI. Final diagnoses:  Non-intractable vomiting with nausea, vomiting of unspecified type  Epigastric abdominal pain       Jacalyn LefevreJulie Arlicia Paquette, MD 04/17/16 1243

## 2016-04-17 NOTE — ED Notes (Signed)
Patient coming from home with c/o of upper abdominal pain and vomiting ongoing since Thursday morning.  Patient has vomiting and dry heaving with unknown number of times.  Patient states he started spitting up blood this morning that was spotty and bright red.

## 2016-04-26 ENCOUNTER — Encounter (HOSPITAL_COMMUNITY): Payer: Self-pay | Admitting: *Deleted

## 2016-04-26 ENCOUNTER — Emergency Department (HOSPITAL_COMMUNITY)
Admission: EM | Admit: 2016-04-26 | Discharge: 2016-04-26 | Disposition: A | Discharge status: Home / Self Care | Payer: 59 | Attending: Emergency Medicine | Admitting: Emergency Medicine

## 2016-04-26 DIAGNOSIS — Z79899 Other long term (current) drug therapy: Secondary | ICD-10-CM | POA: Diagnosis not present

## 2016-04-26 DIAGNOSIS — G43A0 Cyclical vomiting, not intractable: Secondary | ICD-10-CM | POA: Insufficient documentation

## 2016-04-26 DIAGNOSIS — F1210 Cannabis abuse, uncomplicated: Secondary | ICD-10-CM | POA: Diagnosis not present

## 2016-04-26 DIAGNOSIS — Z87891 Personal history of nicotine dependence: Secondary | ICD-10-CM | POA: Insufficient documentation

## 2016-04-26 DIAGNOSIS — R112 Nausea with vomiting, unspecified: Secondary | ICD-10-CM | POA: Diagnosis present

## 2016-04-26 DIAGNOSIS — R1115 Cyclical vomiting syndrome unrelated to migraine: Secondary | ICD-10-CM

## 2016-04-26 LAB — COMPREHENSIVE METABOLIC PANEL
ALK PHOS: 58 U/L (ref 38–126)
ALT: 20 U/L (ref 17–63)
ANION GAP: 11 (ref 5–15)
AST: 17 U/L (ref 15–41)
Albumin: 3.8 g/dL (ref 3.5–5.0)
BILIRUBIN TOTAL: 0.5 mg/dL (ref 0.3–1.2)
BUN: 14 mg/dL (ref 6–20)
CALCIUM: 8.8 mg/dL — AB (ref 8.9–10.3)
CO2: 24 mmol/L (ref 22–32)
Chloride: 104 mmol/L (ref 101–111)
Creatinine, Ser: 0.9 mg/dL (ref 0.61–1.24)
Glucose, Bld: 111 mg/dL — ABNORMAL HIGH (ref 65–99)
Potassium: 3.7 mmol/L (ref 3.5–5.1)
Sodium: 139 mmol/L (ref 135–145)
TOTAL PROTEIN: 6.4 g/dL — AB (ref 6.5–8.1)

## 2016-04-26 LAB — URINE MICROSCOPIC-ADD ON

## 2016-04-26 LAB — CBC
HCT: 43.9 % (ref 39.0–52.0)
Hemoglobin: 15.2 g/dL (ref 13.0–17.0)
MCH: 30.6 pg (ref 26.0–34.0)
MCHC: 34.6 g/dL (ref 30.0–36.0)
MCV: 88.5 fL (ref 78.0–100.0)
PLATELETS: 237 10*3/uL (ref 150–400)
RBC: 4.96 MIL/uL (ref 4.22–5.81)
RDW: 14.1 % (ref 11.5–15.5)
WBC: 10.8 10*3/uL — AB (ref 4.0–10.5)

## 2016-04-26 LAB — URINALYSIS, ROUTINE W REFLEX MICROSCOPIC
Bilirubin Urine: NEGATIVE
Glucose, UA: NEGATIVE mg/dL
HGB URINE DIPSTICK: NEGATIVE
Ketones, ur: NEGATIVE mg/dL
LEUKOCYTES UA: NEGATIVE
NITRITE: NEGATIVE
PROTEIN: NEGATIVE mg/dL
Specific Gravity, Urine: 1.017 (ref 1.005–1.030)
pH: 8 (ref 5.0–8.0)

## 2016-04-26 LAB — RAPID URINE DRUG SCREEN, HOSP PERFORMED
AMPHETAMINES: NOT DETECTED
Barbiturates: NOT DETECTED
Benzodiazepines: NOT DETECTED
Cocaine: NOT DETECTED
OPIATES: NOT DETECTED
Tetrahydrocannabinol: POSITIVE — AB

## 2016-04-26 LAB — LIPASE, BLOOD: Lipase: 28 U/L (ref 11–51)

## 2016-04-26 MED ORDER — GI COCKTAIL ~~LOC~~
30.0000 mL | Freq: Once | ORAL | Status: AC
  Administered 2016-04-26: 30 mL via ORAL
  Filled 2016-04-26: qty 30

## 2016-04-26 MED ORDER — METOCLOPRAMIDE HCL 5 MG/ML IJ SOLN
10.0000 mg | Freq: Once | INTRAMUSCULAR | Status: AC
  Administered 2016-04-26: 10 mg via INTRAVENOUS
  Filled 2016-04-26: qty 2

## 2016-04-26 MED ORDER — SODIUM CHLORIDE 0.9 % IV BOLUS (SEPSIS)
500.0000 mL | Freq: Once | INTRAVENOUS | Status: AC
  Administered 2016-04-26: 500 mL via INTRAVENOUS

## 2016-04-26 MED ORDER — METOCLOPRAMIDE HCL 10 MG PO TABS: 10.0000 mg | ORAL_TABLET | Freq: Four times a day (QID) | ORAL | Status: AC

## 2016-04-26 NOTE — Discharge Instructions (Signed)
There does not appear to be an emergent cause for your symptoms at this time. Your labs and exam are reassuring. Please follow-up with your doctor for further evaluation and management of your symptoms. Follow-up with your GI doctor as previously referred. Your drug screen was positive for marijuana, this can cause you to be nauseous and vomit. Return to ED for new or worsening symptoms.

## 2016-04-26 NOTE — ED Provider Notes (Signed)
CSN: [id number]     Arrival date & time 04/26/16  1835 History   First MD Initiated Contact with Patient 04/26/16 2038     Chief Complaint  Patient presents with  . Emesis     (Consider location/radiation/quality/duration/timing/severity/associated sxs/prior Treatment) HPI Willie Scott is a 45 y.o. male who comes in for evaluation of nausea and vomiting. Patient reports symptoms have been ongoing and intermittent over the past 2 weeks. States he was seen in the emergency department twice for this problem. Symptoms originally improved with antiemetics, Phenergan. Reports initial diarrhea 2 weeks ago, but this has resolved. Emesis is characterized as white, then yellow, then slightly pink. Reports associated mid epigastric discomfort only when vomiting. Denies any recent hospitalizations, medication changes, antibiotic use, drug use-specifically marijuana, urinary symptoms, flank pain, fevers.  History reviewed. No pertinent past medical history. History reviewed. No pertinent past surgical history. No family history on file. Social History  Substance Use Topics  . Smoking status: Former Smoker    Quit date: 12/18/1991  . Smokeless tobacco: None  . Alcohol Use: No    Review of Systems A 10 point review of systems was completed and was negative except for pertinent positives and negatives as mentioned in the history of present illness     Allergies  Review of patient's allergies indicates no known allergies.  Home Medications   Prior to Admission medications   Medication Sig Start Date End Date Taking? Authorizing Provider  famotidine (PEPCID) 20 MG tablet Take 1 tablet (20 mg total) by mouth 2 (two) times daily. 04/17/16  Yes Jacalyn LefevreJulie Haviland, MD  ondansetron (ZOFRAN) 4 MG tablet Take 1 tablet (4 mg total) by mouth every 6 (six) hours. 04/11/15  Yes Jeffrey Hedges, PA-C  alum & mag hydroxide-simeth (MAALOX/MYLANTA) 200-200-20 MG/5ML suspension Take 30 mLs by mouth every 6 (six) hours  as needed for indigestion or heartburn.    Historical Provider, MD  metoCLOPramide (REGLAN) 10 MG tablet Take 1 tablet (10 mg total) by mouth every 6 (six) hours. 04/26/16   Joycie PeekBenjamin Veto Macqueen, PA-C  promethazine (PHENERGAN) 25 MG suppository Place 1 suppository (25 mg total) rectally every 6 (six) hours as needed for nausea or vomiting. 04/17/16   Jacalyn LefevreJulie Haviland, MD   BP 132/78 mmHg  Pulse 84  Temp(Src) 98 F (36.7 C) (Oral)  Resp 18  SpO2 96% Physical Exam  Constitutional: He is oriented to person, place, and time. He appears well-developed and well-nourished.  HENT:  Head: Normocephalic and atraumatic.  Mouth/Throat: Oropharynx is clear and moist.  Eyes: Conjunctivae are normal. Pupils are equal, round, and reactive to light. Right eye exhibits no discharge. Left eye exhibits no discharge. No scleral icterus.  Neck: Neck supple.  Cardiovascular: Normal rate, regular rhythm and normal heart sounds.   Pulmonary/Chest: Effort normal and breath sounds normal. No respiratory distress. He has no wheezes. He has no rales.  Abdominal: Soft.  Mild to moderate epigastric tenderness with palpation. Abdomen is otherwise soft, nondistended without rebound or guarding. No other lesions or deformities noted  Musculoskeletal: He exhibits no tenderness.  Neurological: He is alert and oriented to person, place, and time.  Cranial Nerves II-XII grossly intact  Skin: Skin is warm and dry. No rash noted.  Psychiatric: He has a normal mood and affect.  Nursing note and vitals reviewed.   ED Course  Procedures (including critical care time) Labs Review Labs Reviewed  COMPREHENSIVE METABOLIC PANEL - Abnormal; Notable for the following:    Glucose, Bld 111 (*)  Calcium 8.8 (*)    Total Protein 6.4 (*)    All other components within normal limits  CBC - Abnormal; Notable for the following:    WBC 10.8 (*)    All other components within normal limits  URINALYSIS, ROUTINE W REFLEX MICROSCOPIC (NOT AT  [facility]) - Abnormal; Notable for the following:    APPearance TURBID (*)    All other components within normal limits  URINE RAPID DRUG SCREEN, HOSP PERFORMED - Abnormal; Notable for the following:    Tetrahydrocannabinol POSITIVE (*)    All other components within normal limits  URINE MICROSCOPIC-ADD ON - Abnormal; Notable for the following:    Squamous Epithelial / LPF 0-5 (*)    Bacteria, UA FEW (*)    All other components within normal limits  LIPASE, BLOOD    Imaging Review No results found. I have personally reviewed and evaluated these images and lab results as part of my medical decision-making.   EKG Interpretation None      MDM  Willie Scott is a 44 y.o. male here for evaluation of nausea and vomiting. Has been evaluated twice in the past 2 weeks for same problem. Benign workup with negative CT on 5/2. States he does not use marijuana, however his urine drug screen is positive for THC. Suspect component of cyclical vomiting secondary to marijuana use. Patient has unremarkable exam today, vitals are stable. Labs obtained in triage are not concerning. Patient does not appear dehydrated, tolerating oral fluids. Sleeping comfortably in exam bed upon reevaluation. DC with Reglan. Follow up PCP. Understands to return for any new or worsening symptoms. Final diagnoses:  Non-intractable cyclical vomiting with nausea        Joycie Peek, PA-C 04/26/16 1610  Pricilla Loveless, MD 04/27/16 1506

## 2016-04-26 NOTE — ED Notes (Signed)
Pt in c/o n/v x 2 wks ago, pt received fluids & IV potassium, pt reports relief of symptoms after d/c at that time with phenergan, pt reports increased nausea & vomiting today, pt c/o diaphoresis, pt c/o x 5 vomiting episodes today, pt c/o constipation, pt A&O x4

## 2016-05-03 ENCOUNTER — Other Ambulatory Visit: Payer: Self-pay | Admitting: Gastroenterology

## 2016-05-03 DIAGNOSIS — R1310 Dysphagia, unspecified: Secondary | ICD-10-CM

## 2016-05-04 ENCOUNTER — Ambulatory Visit
Admission: RE | Admit: 2016-05-04 | Discharge: 2016-05-04 | Disposition: A | Discharge status: Home / Self Care | Payer: 59 | Source: Ambulatory Visit | Attending: Gastroenterology | Admitting: Gastroenterology

## 2016-05-04 DIAGNOSIS — R1310 Dysphagia, unspecified: Secondary | ICD-10-CM

## 2016-11-21 DIAGNOSIS — R112 Nausea with vomiting, unspecified: Secondary | ICD-10-CM | POA: Diagnosis not present

## 2016-11-21 DIAGNOSIS — R197 Diarrhea, unspecified: Secondary | ICD-10-CM | POA: Diagnosis not present

## 2016-11-21 DIAGNOSIS — R109 Unspecified abdominal pain: Secondary | ICD-10-CM | POA: Diagnosis not present

## 2016-11-21 DIAGNOSIS — Z87891 Personal history of nicotine dependence: Secondary | ICD-10-CM | POA: Diagnosis not present

## 2017-01-31 DIAGNOSIS — R10816 Epigastric abdominal tenderness: Secondary | ICD-10-CM | POA: Diagnosis not present

## 2017-01-31 DIAGNOSIS — K439 Ventral hernia without obstruction or gangrene: Secondary | ICD-10-CM | POA: Diagnosis not present

## 2017-01-31 DIAGNOSIS — K603 Anal fistula: Secondary | ICD-10-CM | POA: Diagnosis not present

## 2017-02-11 DIAGNOSIS — K921 Melena: Secondary | ICD-10-CM | POA: Diagnosis not present

## 2017-03-01 DIAGNOSIS — K604 Rectal fistula: Secondary | ICD-10-CM | POA: Diagnosis not present

## 2017-03-01 DIAGNOSIS — K219 Gastro-esophageal reflux disease without esophagitis: Secondary | ICD-10-CM | POA: Diagnosis not present

## 2017-03-01 DIAGNOSIS — R1310 Dysphagia, unspecified: Secondary | ICD-10-CM | POA: Diagnosis not present

## 2017-03-01 DIAGNOSIS — K222 Esophageal obstruction: Secondary | ICD-10-CM | POA: Diagnosis not present

## 2017-03-01 DIAGNOSIS — K209 Esophagitis, unspecified: Secondary | ICD-10-CM | POA: Diagnosis not present

## 2017-03-01 DIAGNOSIS — K648 Other hemorrhoids: Secondary | ICD-10-CM | POA: Diagnosis not present

## 2017-03-01 DIAGNOSIS — Z79899 Other long term (current) drug therapy: Secondary | ICD-10-CM | POA: Diagnosis not present

## 2017-03-01 DIAGNOSIS — K449 Diaphragmatic hernia without obstruction or gangrene: Secondary | ICD-10-CM | POA: Diagnosis not present

## 2017-04-03 DIAGNOSIS — K604 Rectal fistula: Secondary | ICD-10-CM | POA: Diagnosis not present

## 2017-07-24 DIAGNOSIS — K439 Ventral hernia without obstruction or gangrene: Secondary | ICD-10-CM | POA: Diagnosis not present

## 2017-08-22 IMAGING — CT CT ABD-PELV W/ CM
2 of 5 series · 16 of 46 positions shown, 18 images · IV contrast (APPLIED)
Comparison: 07/01/2014

CLINICAL DATA: Mid abdominal pain, nausea/vomiting

EXAM:
CT ABDOMEN AND PELVIS WITH CONTRAST
TECHNIQUE: Multidetector CT imaging of the abdomen and pelvis was performed
using the standard protocol following bolus administration of
intravenous contrast.
CONTRAST:  100 VGFXD8-6TT IOPAMIDOL (VGFXD8-6TT) INJECTION 61%

[Series 2: abd/ pelvis 5.0 i30f 1 · axial · 0.72mm/px · z∈[-1051,-636]mm · 13 of 93 slices shown, 15 images]
[im 5/93  soft-tissue]
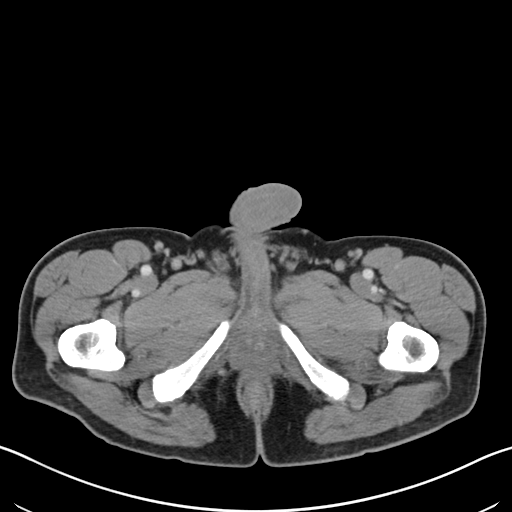
[im 5/93  bone]
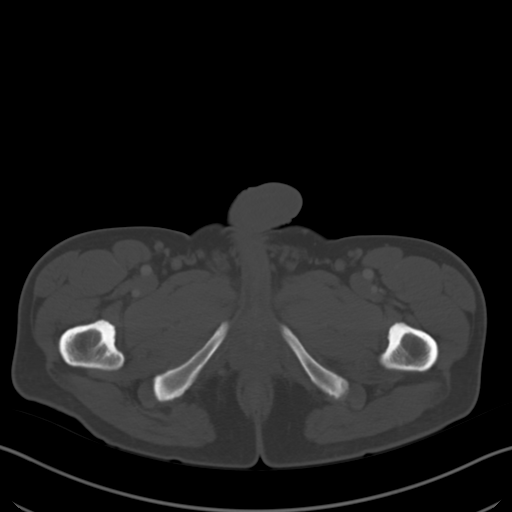
[im 14/93  soft-tissue]
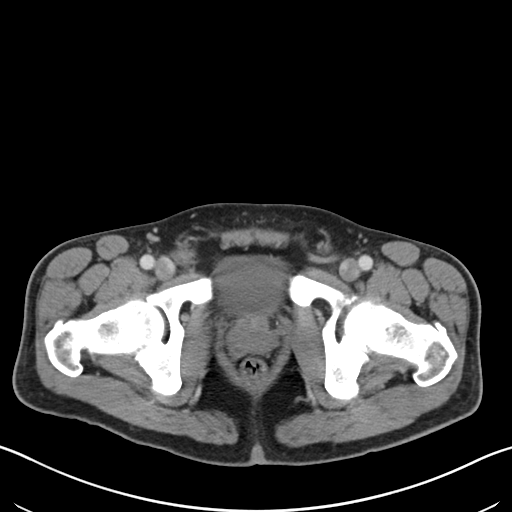
[im 18/93  soft-tissue]
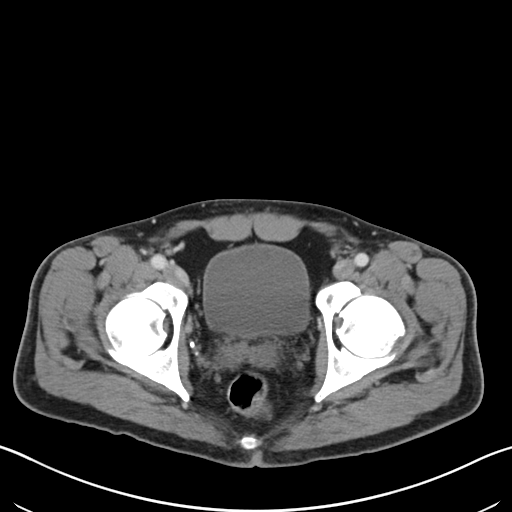
[im 27/93  soft-tissue]
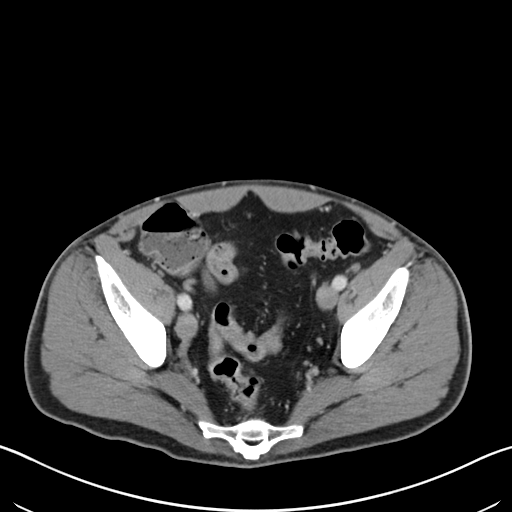
[im 31/93  soft-tissue]
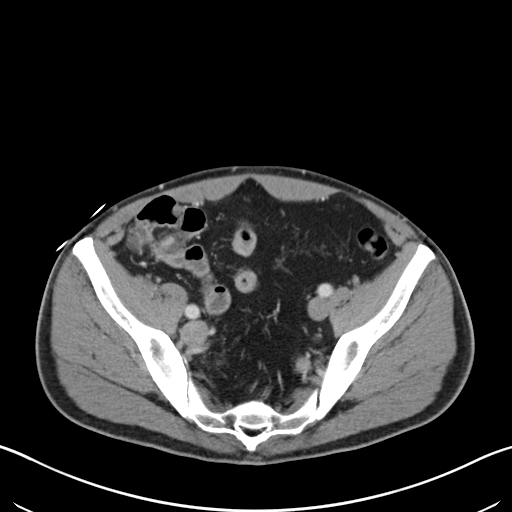
[im 40/93  soft-tissue]
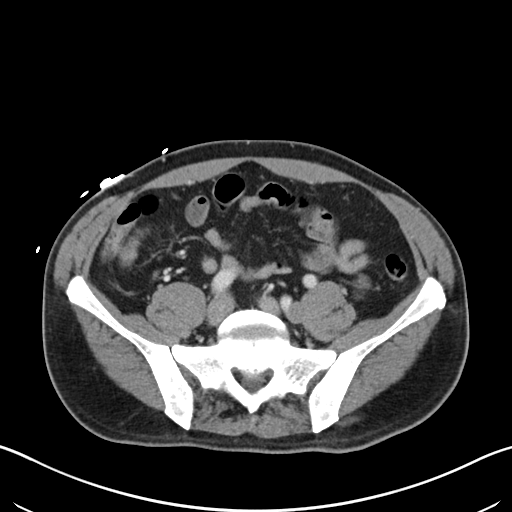
[im 49/93  soft-tissue]
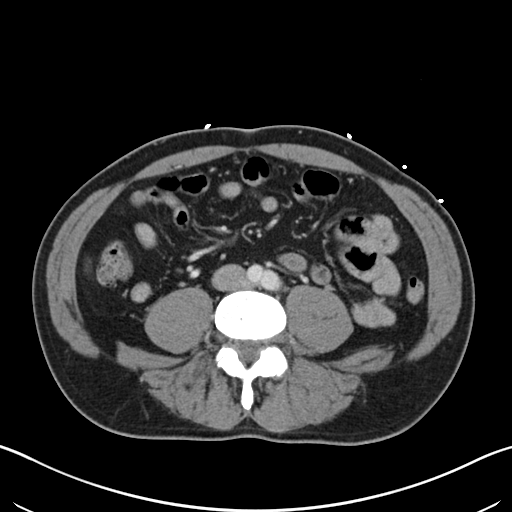
[im 53/93  soft-tissue]
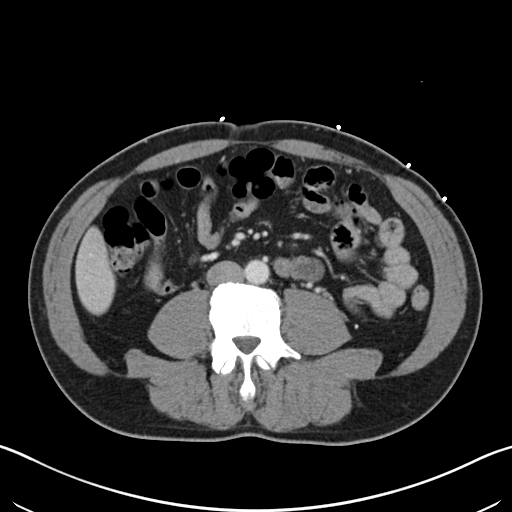
[im 62/93  soft-tissue]
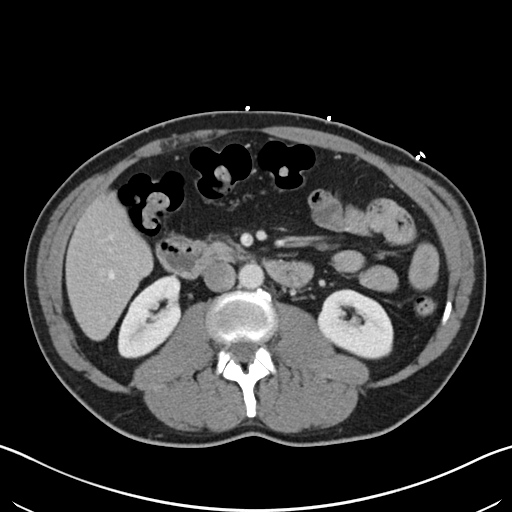
[im 62/93  bone]
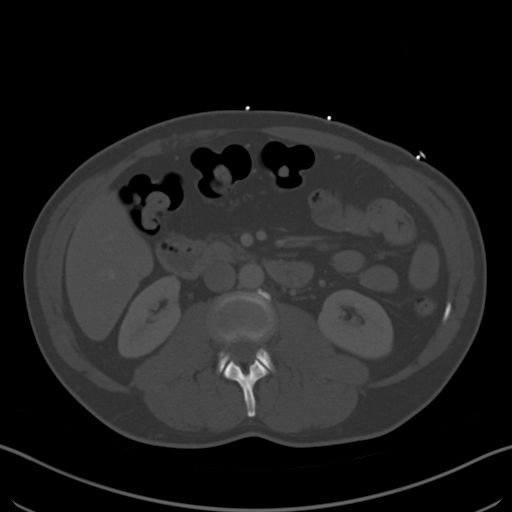
[im 66/93  soft-tissue]
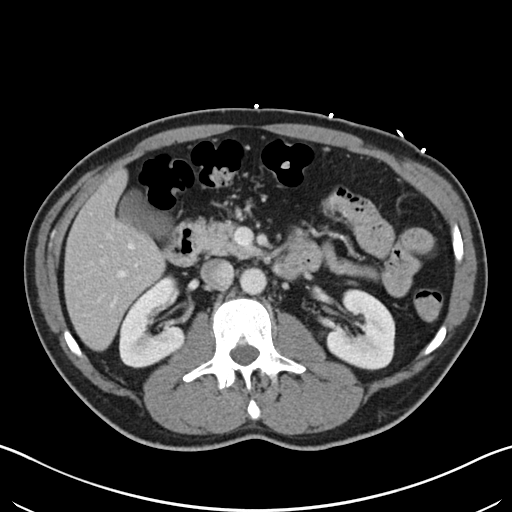
[im 75/93  soft-tissue]
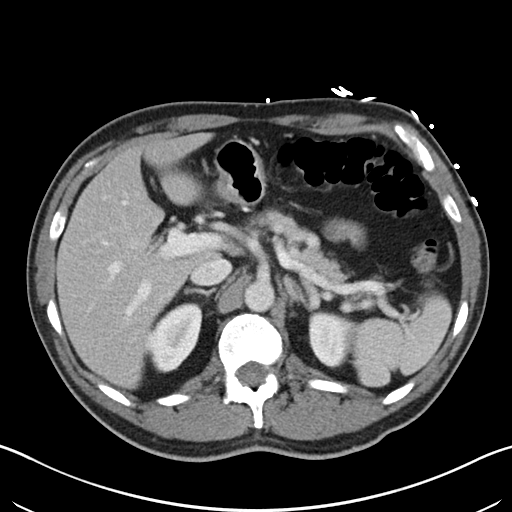
[im 79/93  soft-tissue]
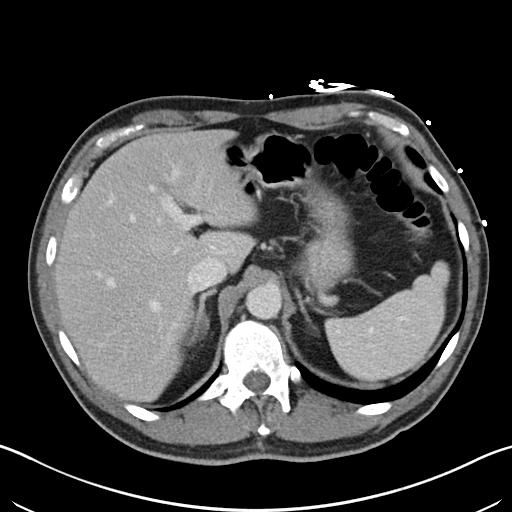
[im 88/93  soft-tissue]
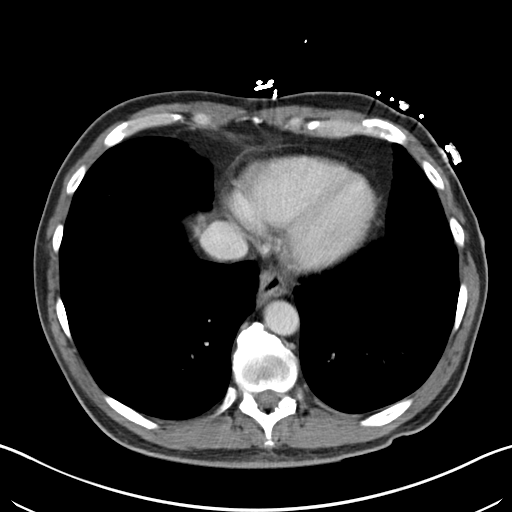

[Series 5: coronal soft tissue · coronal · 0.79mm/px · 3 of 101 slices shown]
[im 34/101  soft-tissue]
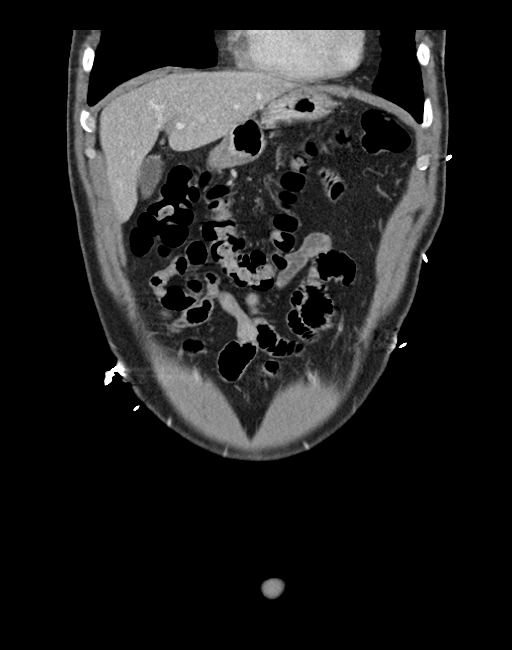
[im 45/101  soft-tissue]
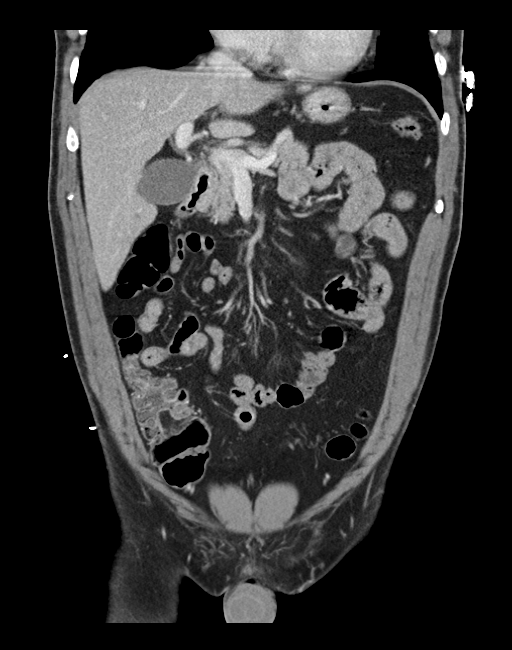
[im 56/101  soft-tissue]
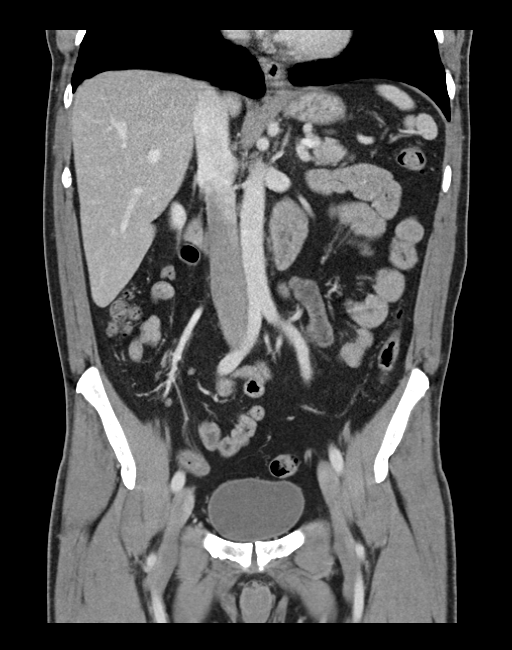

[16 of 46 positions shown; findings below may reference images not displayed]

FINDINGS: Lower chest:  Lung bases are clear.  Normal heart size.

Hepatobiliary: Normal liver.  Normal gallbladder.

Pancreas: Normal.

Spleen: Normal.

Adrenals/Urinary Tract: Normal adrenal glands. Normal kidneys.
Normal bladder. No urolithiasis or obstructive uropathy.

Stomach/Bowel: No bowel wall thickening or dilatation. No
pneumatosis, pneumoperitoneum or portal venous gas. No abdominal or
pelvic free fluid. Normal appendix.

Vascular/Lymphatic: Normal caliber abdominal aorta. No
lymphadenopathy.

Other: No fluid collection or hematoma.

Musculoskeletal: No acute osseous abnormality. No lytic or sclerotic
osseous lesion. Mild degenerative changes of the acromioclavicular
joint.
IMPRESSION: 1. No acute abdominal or pelvic pathology.

## 2018-03-26 ENCOUNTER — Emergency Department (HOSPITAL_COMMUNITY)
Admission: EM | Admit: 2018-03-26 | Discharge: 2018-03-26 | Disposition: A | Discharge status: Home / Self Care | Payer: BLUE CROSS/BLUE SHIELD | Attending: Emergency Medicine | Admitting: Emergency Medicine

## 2018-03-26 ENCOUNTER — Encounter (HOSPITAL_COMMUNITY): Payer: Self-pay

## 2018-03-26 ENCOUNTER — Other Ambulatory Visit: Payer: Self-pay

## 2018-03-26 DIAGNOSIS — Z79899 Other long term (current) drug therapy: Secondary | ICD-10-CM | POA: Diagnosis not present

## 2018-03-26 DIAGNOSIS — Z87891 Personal history of nicotine dependence: Secondary | ICD-10-CM | POA: Diagnosis not present

## 2018-03-26 DIAGNOSIS — R112 Nausea with vomiting, unspecified: Secondary | ICD-10-CM | POA: Diagnosis not present

## 2018-03-26 DIAGNOSIS — R197 Diarrhea, unspecified: Secondary | ICD-10-CM

## 2018-03-26 DIAGNOSIS — R1013 Epigastric pain: Secondary | ICD-10-CM

## 2018-03-26 LAB — LIPASE, BLOOD: LIPASE: 29 U/L (ref 11–51)

## 2018-03-26 LAB — CBC
HCT: 46.2 % (ref 39.0–52.0)
HEMOGLOBIN: 15.6 g/dL (ref 13.0–17.0)
MCH: 29.5 pg (ref 26.0–34.0)
MCHC: 33.8 g/dL (ref 30.0–36.0)
MCV: 87.5 fL (ref 78.0–100.0)
PLATELETS: 233 10*3/uL (ref 150–400)
RBC: 5.28 MIL/uL (ref 4.22–5.81)
RDW: 14.7 % (ref 11.5–15.5)
WBC: 9.9 10*3/uL (ref 4.0–10.5)

## 2018-03-26 LAB — COMPREHENSIVE METABOLIC PANEL
ALT: 28 U/L (ref 17–63)
ANION GAP: 12 (ref 5–15)
AST: 24 U/L (ref 15–41)
Albumin: 4.3 g/dL (ref 3.5–5.0)
Alkaline Phosphatase: 80 U/L (ref 38–126)
BUN: 20 mg/dL (ref 6–20)
CHLORIDE: 102 mmol/L (ref 101–111)
CO2: 23 mmol/L (ref 22–32)
CREATININE: 0.96 mg/dL (ref 0.61–1.24)
Calcium: 8.7 mg/dL — ABNORMAL LOW (ref 8.9–10.3)
GFR calc Af Amer: >60 mL/min (ref >60)
Glucose, Bld: 137 mg/dL — ABNORMAL HIGH (ref 65–99)
Potassium: 3.5 mmol/L (ref 3.5–5.1)
Sodium: 137 mmol/L (ref 135–145)
Total Bilirubin: 0.9 mg/dL (ref 0.3–1.2)
Total Protein: 7.3 g/dL (ref 6.5–8.1)

## 2018-03-26 LAB — URINALYSIS, ROUTINE W REFLEX MICROSCOPIC
BILIRUBIN URINE: NEGATIVE
GLUCOSE, UA: 50 mg/dL — AB
Hgb urine dipstick: NEGATIVE
KETONES UR: 5 mg/dL — AB
Leukocytes, UA: NEGATIVE
Nitrite: NEGATIVE
PH: 7 (ref 5.0–8.0)
Protein, ur: 30 mg/dL — AB
Specific Gravity, Urine: 1.019 (ref 1.005–1.030)
Squamous Epithelial / LPF: NONE SEEN

## 2018-03-26 MED ORDER — METOCLOPRAMIDE HCL 5 MG/ML IJ SOLN
5.0000 mg | Freq: Once | INTRAMUSCULAR | Status: DC
  Filled 2018-03-26: qty 2

## 2018-03-26 MED ORDER — ONDANSETRON HCL 4 MG/2ML IJ SOLN
4.0000 mg | Freq: Once | INTRAMUSCULAR | Status: AC
  Administered 2018-03-26: 4 mg via INTRAVENOUS

## 2018-03-26 MED ORDER — DICYCLOMINE HCL 20 MG PO TABS: 20.0000 mg | ORAL_TABLET | Freq: Two times a day (BID) | ORAL | 0 refills | Status: AC

## 2018-03-26 MED ORDER — ONDANSETRON 4 MG PO TBDP: 4.0000 mg | ORAL_TABLET | Freq: Three times a day (TID) | ORAL | 0 refills | Status: AC | PRN

## 2018-03-26 MED ORDER — ONDANSETRON 4 MG PO TBDP
4.0000 mg | ORAL_TABLET | Freq: Once | ORAL | Status: AC | PRN
  Administered 2018-03-26: 4 mg via ORAL
  Filled 2018-03-26: qty 1

## 2018-03-26 MED ORDER — DICYCLOMINE HCL 10 MG PO CAPS
20.0000 mg | ORAL_CAPSULE | Freq: Once | ORAL | Status: AC
  Administered 2018-03-26: 20 mg via ORAL
  Filled 2018-03-26: qty 2

## 2018-03-26 MED ORDER — SODIUM CHLORIDE 0.9 % IV BOLUS
1000.0000 mL | Freq: Once | INTRAVENOUS | Status: AC
  Administered 2018-03-26: 1000 mL via INTRAVENOUS

## 2018-03-26 NOTE — ED Triage Notes (Signed)
Pt arrived from home states "I think I have the flu I have been throwing up since this morning and coughing"

## 2018-03-26 NOTE — ED Provider Notes (Signed)
[facility] EMERGENCY DEPARTMENT Provider Note   CSN: [id number] Arrival date & time: 03/26/18  1116     History   Chief Complaint Chief Complaint  Patient presents with  . Emesis    HPI Willie Scott is a 47 y.o. male.  HPI   Willie Scott is a 47 y.o. male, patient with no pertinent past medical history, presenting to the ED with epigastric and upper abdominal pain beginning last night. Pain is constant, "feels like a fist in my stomach," also feels like crawling, 2/10, nonradiating.  Endorses 15-20 episodes of nonbloody, nonbilious emesis beginning this morning. Loose stools beginning this morning. Contact with people at work with similar symptoms.   Denies fever/chills, chest pain, shortness of breath, hematochezia/melena, urinary symptoms, or any other complaints.  History reviewed. No pertinent past medical history.  There are no active problems to display for this patient.   History reviewed. No pertinent surgical history.      Home Medications    Prior to Admission medications   Medication Sig Start Date End Date Taking? Authorizing Provider  alum & mag hydroxide-simeth (MAALOX/MYLANTA) 200-200-20 MG/5ML suspension Take 30 mLs by mouth every 6 (six) hours as needed for indigestion or heartburn.    [provider]  dicyclomine (BENTYL) 20 MG tablet Take 1 tablet (20 mg total) by mouth 2 (two) times daily. 03/26/18   Joy, Shawn C, PA-C  famotidine (PEPCID) 20 MG tablet Take 1 tablet (20 mg total) by mouth 2 (two) times daily. 04/17/16   Jacalyn Lefevre, MD  metoCLOPramide (REGLAN) 10 MG tablet Take 1 tablet (10 mg total) by mouth every 6 (six) hours. 04/26/16   Cartner, Sharlet Salina, PA-C  ondansetron (ZOFRAN ODT) 4 MG disintegrating tablet Take 1 tablet (4 mg total) by mouth every 8 (eight) hours as needed for nausea or vomiting. 03/26/18   Joy, Shawn C, PA-C  ondansetron (ZOFRAN) 4 MG tablet Take 1 tablet (4 mg total) by mouth every 6 (six)  hours. 04/11/15   Hedges, Tinnie Gens, PA-C  promethazine (PHENERGAN) 25 MG suppository Place 1 suppository (25 mg total) rectally every 6 (six) hours as needed for nausea or vomiting. 04/17/16   Jacalyn Lefevre, MD    Family History No family history on file.  Social History Social History   Tobacco Use  . Smoking status: Former Smoker    Last attempt to quit: 12/18/1991    Years since quitting: 26.2  . Smokeless tobacco: Never Used  Substance Use Topics  . Alcohol use: No  . Drug use: Yes    Types: Marijuana     Allergies   Patient has no known allergies.   Review of Systems Review of Systems  Constitutional: Negative for chills, diaphoresis and fever.  Respiratory: Negative for shortness of breath.   Cardiovascular: Negative for chest pain.  Gastrointestinal: Positive for abdominal pain, diarrhea, nausea and vomiting. Negative for blood in stool.  Genitourinary: Negative for dysuria, flank pain, frequency and hematuria.  Musculoskeletal: Negative for back pain.  All other systems reviewed and are negative.    Physical Exam Updated Vital Signs BP (!) 164/102   Pulse (!) 59   Temp 98.2 F (36.8 C) (Oral)   Resp 16   Ht 5\' 10"  (1.778 m)   Wt 81.6 kg (180 lb)   SpO2 99%   BMI 25.83 kg/m   Physical Exam  Constitutional: He appears well-developed and well-nourished. No distress.  HENT:  Head: Normocephalic and atraumatic.  Eyes: Conjunctivae are normal.  Neck: Neck supple.  Cardiovascular: Normal rate, regular rhythm, normal heart sounds and intact distal pulses.  Pulmonary/Chest: Effort normal and breath sounds normal. No respiratory distress.  Abdominal: Soft. Bowel sounds are normal. There is tenderness. There is no guarding, no CVA tenderness, no tenderness at McBurney's point and negative Murphy's sign.    Musculoskeletal: He exhibits no edema.  Lymphadenopathy:    He has no cervical adenopathy.  Neurological: He is alert.  Skin: Skin is warm and dry. He is  not diaphoretic.  Psychiatric: He has a normal mood and affect. His behavior is normal.  Nursing note and vitals reviewed.    ED Treatments / Results  Labs (all labs ordered are listed, but only abnormal results are displayed) Labs Reviewed  COMPREHENSIVE METABOLIC PANEL - Abnormal; Notable for the following components:      Result Value   Glucose, Bld 137 (*)    Calcium 8.7 (*)    All other components within normal limits  URINALYSIS, ROUTINE W REFLEX MICROSCOPIC - Abnormal; Notable for the following components:   APPearance CLOUDY (*)    Glucose, UA 50 (*)    Ketones, ur 5 (*)    Protein, ur 30 (*)    Bacteria, UA RARE (*)    All other components within normal limits  LIPASE, BLOOD  CBC    EKG None  Radiology No results found.  Procedures Procedures (including critical care time)  Medications Ordered in ED Medications  ondansetron (ZOFRAN-ODT) disintegrating tablet 4 mg (4 mg Oral Given 03/26/18 1202)  sodium chloride 0.9 % bolus 1,000 mL (0 mLs Intravenous Stopped 03/26/18 1625)  ondansetron (ZOFRAN) injection 4 mg (4 mg Intravenous Given 03/26/18 1441)  dicyclomine (BENTYL) capsule 20 mg (20 mg Oral Given 03/26/18 1447)     Initial Impression / Assessment and Plan / ED Course  I have reviewed the triage vital signs and the nursing notes.  Pertinent labs & imaging results that were available during my care of the patient were reviewed by me and considered in my medical decision making (see chart for details).  Clinical Course as of Mar 27 1711  Wed Mar 26, 2018  1518 Patient states he feels much better.  Nausea and pain have resolved.  No abdominal tenderness on reexam.   [SJ]  1700 Patient reassessed.  Continues to be symptom-free.  Abdominal exam continues to be benign.   [SJ]    Clinical Course User Index [SJ] Joy, Shawn C, PA-C    Patient presents with abdominal pain, nausea, vomiting, and diarrhea. Patient is nontoxic appearing, afebrile, not  tachycardic, not tachypneic, not hypotensive, maintains adequate SPO2 on room air, and is in no apparent distress.  No leukocytosis. Patient's pain is mild, is not colicky in nature, and he does not have abdominal tenderness anywhere else.  Patient's symptoms and tenderness resolved with minimal intervention.   The patient was given instructions for home care as well as return precautions. Patient voices understanding of these instructions, accepts the plan, and is comfortable with discharge.  Vitals:   03/26/18 1320 03/26/18 1500 03/26/18 1615 03/26/18 1700  BP: (!) 140/93 (!) 161/90 (!) 141/96 (!) 157/97  Pulse: 60 61 99 70  Resp: 18   16  Temp:      TempSrc:      SpO2: 98% 94% 95% 94%  Weight:      Height:         Final Clinical Impressions(s) / ED Diagnoses   Final diagnoses:  Nausea vomiting  and diarrhea  Epigastric pain    ED Discharge Orders        Ordered    ondansetron (ZOFRAN ODT) 4 MG disintegrating tablet  Every 8 hours PRN     03/26/18 1539    dicyclomine (BENTYL) 20 MG tablet  2 times daily     03/26/18 1539       Anselm PancoastJoy, Shawn C, PA-C 03/26/18 1714    Mancel BaleWentz, Elliott, MD 03/27/18 1450

## 2018-03-26 NOTE — Discharge Instructions (Addendum)
° °  Hand washing: Wash your hands throughout the day, but especially before and after touching the face, using the restroom, sneezing, coughing, or touching surfaces that have been coughed or sneezed upon. Hydration: Symptoms will be intensified and complicated by dehydration. Dehydration can also extend the duration of symptoms. Drink plenty of fluids and get plenty of rest. You should be drinking at least half a liter of water an hour to stay hydrated. Electrolyte drinks (ex. Gatorade, Powerade, Pedialyte) are also encouraged. You should be drinking enough fluids to make your urine light yellow, almost clear. If this is not the case, you are not drinking enough water. Please note that some of the treatments indicated below will not be effective if you are not adequately hydrated. Diet: Please concentrate on hydration, however, you may introduce food slowly.  Start with a clear liquid diet, progressed to a full liquid diet, and then bland solids as you are able. Pain or fever: Ibuprofen, Naproxen, or Tylenol for pain or fever.  Nausea/vomiting: Use the Zofran for nausea or vomiting.  Bentyl: May use the Bentyl for abdominal discomfort. Follow up: Follow up with a primary care provider, as needed, for any future management of this issue. Return: Return to the ED for any worsening symptoms, changing pain, persistent vomiting that does not respond to medications, or any other major concerns.

## 2018-03-26 NOTE — ED Notes (Signed)
Pt provided with ginger ale and crackers

## 2018-07-03 DIAGNOSIS — G500 Trigeminal neuralgia: Secondary | ICD-10-CM | POA: Diagnosis not present

## 2018-07-07 DIAGNOSIS — R51 Headache: Secondary | ICD-10-CM | POA: Diagnosis not present

## 2018-09-11 ENCOUNTER — Telehealth: Payer: Self-pay | Admitting: Neurology

## 2018-09-11 ENCOUNTER — Ambulatory Visit: Payer: BLUE CROSS/BLUE SHIELD | Admitting: Neurology

## 2018-09-11 NOTE — Telephone Encounter (Signed)
This patient did not show for a new patient appointment today. 

## 2018-09-15 ENCOUNTER — Encounter: Payer: Self-pay | Admitting: Neurology

## 2019-01-20 DIAGNOSIS — R1110 Vomiting, unspecified: Secondary | ICD-10-CM | POA: Diagnosis not present

## 2019-01-20 DIAGNOSIS — R109 Unspecified abdominal pain: Secondary | ICD-10-CM | POA: Diagnosis not present

## 2019-03-31 DIAGNOSIS — F1210 Cannabis abuse, uncomplicated: Secondary | ICD-10-CM | POA: Diagnosis not present

## 2019-03-31 DIAGNOSIS — R1013 Epigastric pain: Secondary | ICD-10-CM | POA: Diagnosis not present

## 2019-03-31 DIAGNOSIS — R112 Nausea with vomiting, unspecified: Secondary | ICD-10-CM | POA: Diagnosis not present

## 2019-04-24 DIAGNOSIS — R1314 Dysphagia, pharyngoesophageal phase: Secondary | ICD-10-CM | POA: Diagnosis not present

## 2019-04-24 DIAGNOSIS — Z1159 Encounter for screening for other viral diseases: Secondary | ICD-10-CM | POA: Diagnosis not present

## 2019-04-24 DIAGNOSIS — K429 Umbilical hernia without obstruction or gangrene: Secondary | ICD-10-CM | POA: Diagnosis not present

## 2019-04-28 DIAGNOSIS — Z1159 Encounter for screening for other viral diseases: Secondary | ICD-10-CM | POA: Diagnosis not present

## 2019-04-28 DIAGNOSIS — K429 Umbilical hernia without obstruction or gangrene: Secondary | ICD-10-CM | POA: Diagnosis not present

## 2019-04-28 DIAGNOSIS — Z01812 Encounter for preprocedural laboratory examination: Secondary | ICD-10-CM | POA: Diagnosis not present

## 2019-04-30 DIAGNOSIS — K449 Diaphragmatic hernia without obstruction or gangrene: Secondary | ICD-10-CM | POA: Diagnosis not present

## 2019-04-30 DIAGNOSIS — K222 Esophageal obstruction: Secondary | ICD-10-CM | POA: Diagnosis not present

## 2019-04-30 DIAGNOSIS — K3189 Other diseases of stomach and duodenum: Secondary | ICD-10-CM | POA: Diagnosis not present

## 2019-04-30 DIAGNOSIS — K259 Gastric ulcer, unspecified as acute or chronic, without hemorrhage or perforation: Secondary | ICD-10-CM | POA: Diagnosis not present

## 2019-04-30 DIAGNOSIS — K2950 Unspecified chronic gastritis without bleeding: Secondary | ICD-10-CM | POA: Diagnosis not present

## 2019-04-30 DIAGNOSIS — K219 Gastro-esophageal reflux disease without esophagitis: Secondary | ICD-10-CM | POA: Diagnosis not present

## 2019-04-30 DIAGNOSIS — R1310 Dysphagia, unspecified: Secondary | ICD-10-CM | POA: Diagnosis not present

## 2019-04-30 DIAGNOSIS — K2930 Chronic superficial gastritis without bleeding: Secondary | ICD-10-CM | POA: Diagnosis not present

## 2019-04-30 DIAGNOSIS — K269 Duodenal ulcer, unspecified as acute or chronic, without hemorrhage or perforation: Secondary | ICD-10-CM | POA: Diagnosis not present

## 2019-05-01 DIAGNOSIS — R1314 Dysphagia, pharyngoesophageal phase: Secondary | ICD-10-CM | POA: Diagnosis not present

## 2019-05-01 DIAGNOSIS — K429 Umbilical hernia without obstruction or gangrene: Secondary | ICD-10-CM | POA: Diagnosis not present

## 2019-05-01 DIAGNOSIS — F17200 Nicotine dependence, unspecified, uncomplicated: Secondary | ICD-10-CM | POA: Diagnosis not present

## 2019-05-01 DIAGNOSIS — K219 Gastro-esophageal reflux disease without esophagitis: Secondary | ICD-10-CM | POA: Diagnosis not present

## 2024-08-13 ENCOUNTER — Ambulatory Visit

## 2025-01-22 ENCOUNTER — Ambulatory Visit: Payer: Self-pay

## 2025-01-22 ENCOUNTER — Ambulatory Visit: Admission: RE | Admit: 2025-01-22 | Source: Ambulatory Visit

## 2025-01-22 VITALS — BP 148/90 | Ht 70.0 in | Wt 240.0 lb

## 2025-01-22 DIAGNOSIS — M2352 Chronic instability of knee, left knee: Secondary | ICD-10-CM

## 2025-01-22 DIAGNOSIS — G8929 Other chronic pain: Secondary | ICD-10-CM

## 2025-01-22 DIAGNOSIS — S83005S Unspecified dislocation of left patella, sequela: Secondary | ICD-10-CM

## 2025-01-22 NOTE — Progress Notes (Cosign Needed)
 [facility] A Department of [facility]    PCP: Patient, No Pcp Per  CHIEF COMPLAINT: Acute on chronic left knee pain  HPI: Patient is a pleasant 54 y.o. male who presents today for further evaluation of acute on chronic left knee pain.  Patient says he initially had accident where he seriously hurt his left knee when falling into a construction ditch in 2005.  Underwent 2 rounds of physical therapy for rehab the left knee but they were unsuccessful in resolving all of his symptoms.  Ultimately patient was recommended surgery to fix issue but he declined at that time for unknown/financial reasons.  Patient has had issues with his left knee ever since initial injury in 2005.  Does describe recurrent patellar dislocations since that time occurring at least once a month as well as locking of his knee that causes him to fall and requires manual extension with hands to get knee moving again.  Also scribes sensations of instability with shifting of his knee.  Patient works as furniture conservator/restorer and pain has gotten so bad he has difficulty with ADLs and job tasks.   PMH: No past medical history on file.  There are no active problems to display for this patient.   PSurg: No past surgical history on file.  Allergies: Patient has no known allergies.  Meds:  Previous Medications   ALUM & MAG HYDROXIDE-SIMETH (MAALOX/MYLANTA) 200-200-20 MG/5ML SUSPENSION    Take 30 mLs by mouth every 6 (six) hours as needed for indigestion or heartburn.   DICYCLOMINE  (BENTYL ) 20 MG TABLET    Take 1 tablet (20 mg total) by mouth 2 (two) times daily.   FAMOTIDINE  (PEPCID ) 20 MG TABLET    Take 1 tablet (20 mg total) by mouth 2 (two) times daily.   METOCLOPRAMIDE  (REGLAN ) 10 MG TABLET    Take 1 tablet (10 mg total) by mouth every 6 (six) hours.   ONDANSETRON  (ZOFRAN  ODT) 4 MG DISINTEGRATING TABLET    Take 1 tablet (4 mg total) by mouth every 8 (eight) hours as needed for nausea  or vomiting.   ONDANSETRON  (ZOFRAN ) 4 MG TABLET    Take 1 tablet (4 mg total) by mouth every 6 (six) hours.   PROMETHAZINE  (PHENERGAN ) 25 MG SUPPOSITORY    Place 1 suppository (25 mg total) rectally every 6 (six) hours as needed for nausea or vomiting.    Social:  Social History   Tobacco Use   Smoking status: Former    Current packs/day: 0.00    Types: Cigarettes    Quit date: 12/18/1991    Years since quitting: 33.1   Smokeless tobacco: Never  Substance Use Topics   Alcohol use: No    REVIEW OF SYSTEMS:  ROS negative except as noted in HPI above   Objective Exam:  Vitals:   01/22/25 1007  BP: (!) 148/90  Weight: 240 lb (108.9 kg)  Height: 5' 10 (1.778 m)    GENERAL: Patient is afebrile, Vital signs reviewed, well appearing, Patient appears comfortable, Alert and lucid. No apparent distress.   Physical Exam   Ortho Exam:  On examination of patient's knee no signs of erythema, ecchymoses-does have moderate knee effusion present.  Exquisite tenderness to palpation along medial joint line and his patella.  Patient has significantly decreased range of motion with difficult physical exam due to severe guarding of his knee secondary to pain.  Patient has maintained lower extremity strength but this does reproduce severe  left knee pain.  Neurovascular intact distally.  Special examination of left knee limited due to guarding.  Suspect positive meniscal signs including Thessaly's and McMurray's.  Patient has lateral patellar laxity suspecting MPFL tear.  Patient has some laxity with anterior drawer, hard to assess firm endpoint secondary to guarding.  Overall, grossly abnormal left knee exam.   RESULTS:  Labs: No results found for this or any previous visit (from the past 48 hours).  Imaging:  MR Knee Left  Wo Contrast    (Results Pending)    Assessment/Plan:  1. Chronic pain of left knee   2. Recurrent left knee instability   3. Patellar dislocation, left, sequela   -  Patient with history of severe trauma to left knee as well as grossly abnormal physical exam with concern for left MPFL tear, large meniscal tearing, and ACL tear given history of instability, locking, recurrent patellar dislocations. - Patient is already failed physical therapy in the past - Not interested in any joint injections at this time for pain relief - Will get imaging for further evaluation including x-ray series and left knee MRI - Follow-up in 3 to 4 weeks to go over imaging and discussing additional treatment options.  New Prescriptions   No medications on file    Medications, medical history, allergies, surgical history, hospitalizations, family history, social history, ROS and vitals entered by nursing staff and reviewed by myself.  I discussed with the patient the diagnosis, treatment plan, indications for return to the emergency department, and for expected follow-up. The patient verbalized an understanding. The patient is asked if there are any questions or concerns. We discuss the case, until all issues are addressed to the patient's satisfaction.  Follow up per instructions including returning for additional office visit if symptoms worsen or proceeding to the emergency department or urgent care in the next 12-24hrs if there is an acute concerning increasing symptoms, pain, fevers, or other symptoms.  Prentice Agent, DO  11:29 AM, 01/22/2025

## 2025-02-01 ENCOUNTER — Other Ambulatory Visit

## 2025-02-18 ENCOUNTER — Ambulatory Visit
